# Patient Record
Sex: Female | Born: 1970 | Hispanic: Refuse to answer | Marital: Married | Smoking: Never smoker
Health system: Southern US, Community
[De-identification: ages and names within clinical notes are randomized; demographics above are authoritative.]

## PROBLEM LIST (undated history)

## (undated) HISTORY — PX: OTHER SURGICAL HISTORY: SHX169

---

## 2010-03-01 ENCOUNTER — Ambulatory Visit (HOSPITAL_COMMUNITY): Admission: RE | Admit: 2010-03-01 | Discharge: 2010-03-01 | Payer: Self-pay | Admitting: Obstetrics and Gynecology

## 2010-03-01 ENCOUNTER — Ambulatory Visit: Payer: Self-pay | Admitting: Internal Medicine

## 2010-03-01 ENCOUNTER — Encounter (INDEPENDENT_AMBULATORY_CARE_PROVIDER_SITE_OTHER): Payer: Self-pay | Admitting: Obstetrics and Gynecology

## 2011-09-18 ENCOUNTER — Encounter: Payer: Self-pay | Admitting: Internal Medicine

## 2011-09-18 ENCOUNTER — Emergency Department (HOSPITAL_COMMUNITY): Payer: BC Managed Care – PPO

## 2011-09-18 ENCOUNTER — Observation Stay (HOSPITAL_COMMUNITY)
Admission: EM | Admit: 2011-09-18 | Discharge: 2011-09-19 | Disposition: A | Discharge status: Home / Self Care | Payer: BC Managed Care – PPO | Attending: Internal Medicine | Admitting: Internal Medicine

## 2011-09-18 DIAGNOSIS — Y998 Other external cause status: Secondary | ICD-10-CM | POA: Insufficient documentation

## 2011-09-18 DIAGNOSIS — W19XXXA Unspecified fall, initial encounter: Secondary | ICD-10-CM | POA: Insufficient documentation

## 2011-09-18 DIAGNOSIS — Y92009 Unspecified place in unspecified non-institutional (private) residence as the place of occurrence of the external cause: Secondary | ICD-10-CM | POA: Insufficient documentation

## 2011-09-18 DIAGNOSIS — S01409A Unspecified open wound of unspecified cheek and temporomandibular area, initial encounter: Secondary | ICD-10-CM | POA: Insufficient documentation

## 2011-09-18 DIAGNOSIS — R55 Syncope and collapse: Secondary | ICD-10-CM

## 2011-09-18 LAB — BASIC METABOLIC PANEL
BUN: 15 mg/dL (ref 6–23)
Calcium: 9.5 mg/dL (ref 8.4–10.5)
Creatinine, Ser: 0.78 mg/dL (ref 0.50–1.10)
GFR calc Af Amer: >90 mL/min (ref >90)
GFR calc non Af Amer: >90 mL/min (ref >90)
Glucose, Bld: 91 mg/dL (ref 70–99)

## 2011-09-18 LAB — DIFFERENTIAL
Basophils Absolute: 0 10*3/uL (ref 0.0–0.1)
Basophils Relative: 0 % (ref 0–1)
Eosinophils Absolute: 0 10*3/uL (ref 0.0–0.7)
Lymphs Abs: 1 10*3/uL (ref 0.7–4.0)
Neutrophils Relative %: 80 % — ABNORMAL HIGH (ref 43–77)

## 2011-09-18 LAB — POCT PREGNANCY, URINE: Preg Test, Ur: NEGATIVE

## 2011-09-18 LAB — URINALYSIS, ROUTINE W REFLEX MICROSCOPIC
Leukocytes, UA: NEGATIVE
Nitrite: NEGATIVE
Specific Gravity, Urine: 1.02 (ref 1.005–1.030)
Urobilinogen, UA: 1 mg/dL (ref 0.0–1.0)
pH: 7.5 (ref 5.0–8.0)

## 2011-09-18 LAB — URINE MICROSCOPIC-ADD ON

## 2011-09-18 LAB — POCT I-STAT TROPONIN I: Troponin i, poc: 0 ng/mL (ref 0.00–0.08)

## 2011-09-18 LAB — CBC
Platelets: 222 10*3/uL (ref 150–400)
RBC: 4.45 MIL/uL (ref 3.87–5.11)
WBC: 7 10*3/uL (ref 4.0–10.5)

## 2011-09-18 LAB — D-DIMER, QUANTITATIVE: D-Dimer, Quant: 0.51 ug/mL-FEU — ABNORMAL HIGH (ref 0.00–0.48)
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
## 2011-09-19 DIAGNOSIS — R55 Syncope and collapse: Secondary | ICD-10-CM

## 2011-09-19 LAB — DRUGS OF ABUSE SCREEN W/O ALC, ROUTINE URINE
Barbiturate Quant, Ur: NEGATIVE
Benzodiazepines.: NEGATIVE
Cocaine Metabolites: NEGATIVE
Methadone: NEGATIVE

## 2011-09-19 LAB — BASIC METABOLIC PANEL
BUN: 11 mg/dL (ref 6–23)
CO2: 26 mEq/L (ref 19–32)
Chloride: 104 mEq/L (ref 96–112)
Creatinine, Ser: 0.76 mg/dL (ref 0.50–1.10)
GFR calc Af Amer: >90 mL/min (ref >90)
Glucose, Bld: 89 mg/dL (ref 70–99)
Potassium: 4.2 mEq/L (ref 3.5–5.1)
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
## 2011-09-25 NOTE — Consult Note (Signed)
NAMEDEON, [name] NO.:  [id number]  MEDICAL RECORD NO.:  [id number]  LOCATION:  4705                         FACILITY:  [facility]  PHYSICIAN:  Newman Pies, MD            DATE OF BIRTH:  16-Jan-1971  DATE OF CONSULTATION:  09/18/2011 DATE OF DISCHARGE:                                CONSULTATION   SERVICE:  Otolaryngology Head and Neck Surgery Service.  CHIEF COMPLAINT:  Left facial laceration, syncope.  HISTORY OF PRESENT ILLNESS:  [name] [name] is a 40 year old female who was transported to [name] Emergency room earlier today, after she experienced an episode of syncope.  According to [name] [name], she fell face first in her living room, cutting [name] left side of her face on a glass door.  Her loss of consciousness was very brief.  She was immediately attended to by her family members.  She was transported to [name] Emergency room for further evaluation and treatment. According to [name] [name], she had another episode of syncope once before, during her pregnancy.  She denies any other history of cardiovascular or neurologic disorders.  Upon arrival at [name] emergency room, [name] [name] was noted to have a large 5-cm laceration of her left midface.  [name] laceration created a 3-cm flap over [name] left preauricular area.  ENT was consulted for further evaluation and treatment of [name] left facial laceration.  PAST MEDICAL HISTORY:  Otherwise healthy.  PAST SURGICAL HISTORY:  C-section.  HOME MEDICATIONS:  Birth control pills.  ALLERGIES:  No known drug allergies.  SOCIAL HISTORY:  [name] [name] denies [name] use of any illegal drugs, alcohol, or tobacco.  PHYSICAL EXAMINATION:  VITAL SIGNS:  Temperature 98.8, blood pressure 100/48, pulse 48, respirations 20, oxygen saturation 100% on room air. GENERAL:  [name] [name] is a well-nourished and well-developed 40 year old white female in no acute distress.  She is alert and oriented x3. HEENT:  Her pupils are  equal, round, and reactive to light.  Extraocular motion is intact.  Examination of [name] ears shows normal auricles and external auditory canals.  Nasal examination shows normal mucosa, septum, and turbinates.  Oral cavity examination shows normal lips, gums, tongue, oral cavity, and oropharyngeal mucosa.  Facial examination shows a left midface laceration, measuring approximately 5 cm in length. It results in a 3-cm skin flap over [name] left preauricular area.  Cranial nerves II through XII are intact.  Specifically, [name] [name] has symmetric facial movement. NECK;  Palpation of [name] neck reveals no lymphadenopathy or mass.  [name] trachea is midline.  [name] thyroid is not significantly enlarged.  PROCEDURE PERFORMED:  Intermediate repair of [name] left midface lacerations (5 cm).  ANESTHESIA:  Local anesthesia with 1% lidocaine with 1:100,000 epinephrine.  DESCRIPTION:  [name] [name] is placed supine on her hospital bed.  [name] laceration site is copiously irrigated with saline solution.  [name] laceration site is sterilely prepped with iodine solution.  Lidocaine 1%with 1:100,000 epinephrine is locally infiltrated around [name] laceration area.  [name] laceration skin flap is then carefully debrided of all nonviable tissue.  It is then reapproximated and sutures in place with 4- 0 Vicryl sutures and 5-0  Prolene sutures in a layered fashion.  [name] deep sutures are used to minimize to [name] skin tension.  Good tension-free closure is achieved without difficulty.  Antibiotic ointment is applied.  IMPRESSION:  Left facial laceration secondary to acute syncope episode.  RECOMMENDATION: 1. Intermediate laceration repair in [name] emergency room. 2. [name] [name] will be placed on Keflex 500 mg p.o. q.i.d. for 5 days.     She may also take Vicodin 1-2 tablets p.o. q.4-6 h. p.r.n. pain.     [name] [name] will follow up in my office in 1 week for suture     removal.     Newman Pies, MD     ST/MEDQ  D:  09/18/2011  T:   09/19/2011  Job:  [id number]  Electronically Signed by Newman Pies MD on 09/25/2011 10:20:28 AM
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
## 2011-11-16 ENCOUNTER — Ambulatory Visit: Payer: BC Managed Care – PPO

## 2012-03-05 ENCOUNTER — Encounter: Payer: Self-pay | Admitting: Sports Medicine

## 2012-03-05 ENCOUNTER — Ambulatory Visit (INDEPENDENT_AMBULATORY_CARE_PROVIDER_SITE_OTHER): Payer: BC Managed Care – PPO | Admitting: Sports Medicine

## 2012-03-05 VITALS — BP 123/70 | HR 48 | Ht 63.0 in | Wt 108.0 lb

## 2012-03-05 DIAGNOSIS — M1711 Unilateral primary osteoarthritis, right knee: Secondary | ICD-10-CM | POA: Insufficient documentation

## 2012-03-05 DIAGNOSIS — S83003A Unspecified subluxation of unspecified patella, initial encounter: Secondary | ICD-10-CM

## 2012-03-05 DIAGNOSIS — M25569 Pain in unspecified knee: Secondary | ICD-10-CM

## 2012-03-05 DIAGNOSIS — M25561 Pain in right knee: Secondary | ICD-10-CM

## 2012-03-05 DIAGNOSIS — S83006A Unspecified dislocation of unspecified patella, initial encounter: Secondary | ICD-10-CM

## 2012-03-05 NOTE — Assessment & Plan Note (Signed)
I think she has had a significant subluxation that has led to an osteochondral loose body seen on [place]  We will follow conservatively However, if this does not resolve she would need arthroscopy

## 2012-03-05 NOTE — Assessment & Plan Note (Signed)
See instructions OTC meds  Ice  Consider injection if not better
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
## 2012-03-11 ENCOUNTER — Ambulatory Visit (INDEPENDENT_AMBULATORY_CARE_PROVIDER_SITE_OTHER): Payer: BC Managed Care – PPO | Admitting: Physician Assistant

## 2012-03-11 VITALS — BP 111/71 | HR 64 | Temp 98.5°F | Resp 16 | Ht 63.0 in | Wt 111.0 lb

## 2012-03-11 DIAGNOSIS — H109 Unspecified conjunctivitis: Secondary | ICD-10-CM

## 2012-03-11 MED ORDER — MOXIFLOXACIN HCL 0.5 % OP SOLN: 1.0000 [drp] | Freq: Three times a day (TID) | OPHTHALMIC | Status: AC

## 2012-03-11 NOTE — Progress Notes (Signed)
  Subjective:    Patient ID: [name] [name], female    DOB: 1971/10/13, 41 y.o.   MRN: [id number]  HPI [name] [name] comes in tonight c/o left eye redness and drainage this morning.  Has had some scratchy throat and itchiness for 2 days and thought it was allergies. Used allergy drops this morning but seemed to make it worse.  No light sensitivity but does have some discomfort.  Thick yellow drainage from eye all day.  Does not wear contact lenses.  No one else sick in house. No vision change today.   Review of Systems As above     Objective:   Physical Exam  HENT:  Right Ear: Tympanic membrane normal.  Left Ear: Tympanic membrane normal.  Nose: Nose normal.  Mouth/Throat: Oropharynx is clear and moist.  Eyes: Pupils are equal, round, and reactive to light. Left eye exhibits chemosis, discharge and exudate. Left eye exhibits no hordeolum. Left conjunctiva is not injected. No scleral icterus. Left eye exhibits normal extraocular motion. Left pupil is round and reactive. Pupils are equal.  Lymphadenopathy:       Head (left side): No preauricular adenopathy present.   Ofloxacin drops given in office.       Assessment & Plan:  Conjunctivitis, bacterial  Home with Ofloxacin drops until to the pharmacy. Vigamox 3 times a day for 7 days. Hand wash.   Watch for increased photophobia, eye pain, vision change.

## 2012-03-17 ENCOUNTER — Ambulatory Visit (INDEPENDENT_AMBULATORY_CARE_PROVIDER_SITE_OTHER): Payer: BC Managed Care – PPO | Admitting: Sports Medicine

## 2012-03-17 VITALS — BP 100/62

## 2012-03-17 DIAGNOSIS — S83003A Unspecified subluxation of unspecified patella, initial encounter: Secondary | ICD-10-CM

## 2012-03-17 DIAGNOSIS — M25569 Pain in unspecified knee: Secondary | ICD-10-CM

## 2012-03-17 DIAGNOSIS — M25561 Pain in right knee: Secondary | ICD-10-CM

## 2012-03-17 DIAGNOSIS — S83006A Unspecified dislocation of unspecified patella, initial encounter: Secondary | ICD-10-CM

## 2012-03-17 NOTE — Patient Instructions (Addendum)
Follow up in 4 wks Start biking Continue with the sleeve Continue quad and VMO training

## 2012-03-18 NOTE — Assessment & Plan Note (Signed)
Improvement with less pain  She is not having to use any NSAIDs  Continue icing and up when necessary medications

## 2012-03-18 NOTE — Assessment & Plan Note (Signed)
Continue using the compression sleeve which I think gives her less subluxation  oK. to start biking and swimming  I think she should avoid running for the next 4 weeks  We discussed that if the loose body continues to get trapped under the kneecap she might have to have arthroscopy to remove this  Recheck 4 weeks

## 2012-03-18 NOTE — Progress Notes (Signed)
  Subjective:    Patient ID: [name] [name], female    DOB: 19-Mar-1971, 41 y.o.   MRN: [id number]  HPI Patient returns for followup of right knee pain Last visit we felt she had a chondral lesion that had broken loose from the right upper patella She had marked swelling and could not walk without limp Now she can walk more comfortably She says this is 50% better Compression sleeve is clearly helping with the swelling Pain is less and she feels that she can swim or bike but would not try running yet  Review of Systems     Objective:   Physical Exam  No acute distress  Right knee shows much less swelling Stable ligaments Patella tracks laterally Tenderness at the upper outer corner persists but is less Quadriceps and patellar tendons are nontender  Walking gait is without a limp today  MSK ultrasound Loose body is less visible today and looks less hyperechoic There is a mild effusion but much less than before Patellar and quadriceps tendons appear normal Menisci look normal      Assessment & Plan:

## 2012-04-14 ENCOUNTER — Ambulatory Visit (INDEPENDENT_AMBULATORY_CARE_PROVIDER_SITE_OTHER): Payer: BC Managed Care – PPO | Admitting: Sports Medicine

## 2012-04-14 VITALS — BP 104/60

## 2012-04-14 DIAGNOSIS — S83003A Unspecified subluxation of unspecified patella, initial encounter: Secondary | ICD-10-CM

## 2012-04-14 DIAGNOSIS — M25561 Pain in right knee: Secondary | ICD-10-CM

## 2012-04-14 DIAGNOSIS — M25569 Pain in unspecified knee: Secondary | ICD-10-CM

## 2012-04-14 DIAGNOSIS — S83006A Unspecified dislocation of unspecified patella, initial encounter: Secondary | ICD-10-CM

## 2012-04-14 NOTE — Assessment & Plan Note (Signed)
This is subluxing much less with swelling down and better strength  I would use compression next 6 wks and maybe we can stop after that
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
## 2012-04-14 NOTE — Assessment & Plan Note (Addendum)
Improving pain however persistent upper lateral pain (from bony fragment) and lower medial pain (from injury to medial patellar ligament following subluxation).  Ultrasound shows bony fragment scarring down and improved patellar effusion.  PLAN: -Stretches to help with flexion and extension of right leg. Diminished range of motion likely from bony fragment.  Passive weigth to assist stretches to try to achieve full extension -May do slow runs and cross train -Follow-up in 6 weeks for re-evaluation.

## 2012-04-14 NOTE — Patient Instructions (Signed)
Exercises/stretches. -Passive extension exercises: foot on book, apply weights on knee to help straighten knee for 1 minute, straighten out. Repeat 5 times.  -Continue straight leg lifts with ankle weights.  -Lay in belly with feet hanging off an edge and use ankle to press right knee straight.   May continue to run but at slower pace.   Use compression all the time if possible, definitely during and after exercise.   Follow-up in 6 weeks.

## 2012-05-26 ENCOUNTER — Ambulatory Visit (INDEPENDENT_AMBULATORY_CARE_PROVIDER_SITE_OTHER): Payer: BC Managed Care – PPO | Admitting: Sports Medicine

## 2012-05-26 VITALS — BP 118/76

## 2012-05-26 DIAGNOSIS — R269 Unspecified abnormalities of gait and mobility: Secondary | ICD-10-CM

## 2012-05-26 DIAGNOSIS — S83003A Unspecified subluxation of unspecified patella, initial encounter: Secondary | ICD-10-CM

## 2012-05-26 DIAGNOSIS — S83006A Unspecified dislocation of unspecified patella, initial encounter: Secondary | ICD-10-CM

## 2012-05-26 NOTE — Patient Instructions (Addendum)
1. Try the different size heel lifts in your shoes to determine which one is the most comfortable.  2. Continue doing your strengthening exercises.  Add the hamstring exercises that we discussed today.  (reverse plank with the ball and the butt stretch on the counter)  3. Follow up in 2-3 months.

## 2012-05-27 DIAGNOSIS — R269 Unspecified abnormalities of gait and mobility: Secondary | ICD-10-CM | POA: Insufficient documentation

## 2012-05-27 NOTE — Assessment & Plan Note (Signed)
She is much improved here.  She will continue her HEP and knee sleeve.

## 2012-05-27 NOTE — Progress Notes (Signed)
  Subjective:    Patient ID: [name] [name], female    DOB: 1971-04-21, 41 y.o.   MRN: [id number]  HPI 41 y/o female is here to follow up on right knee pain secondary to subluxation.  Her symptoms are 40% improved.  She is doing her HEP and wearing her sleeve for running.  She occasionally gets buttock and hamstring pain with running that occasionally radiates down the leg. It lasts for seconds and then resolves.  She doesn't have these symptoms at rest.  She has been able to return to even longer runs without much pain now   Review of Systems     Objective:   Physical Exam  Right leg is almost a cm longer than the left No pain with rotation of the hip Normal ROM of the hip and knee No tenderness to palpation of the posterior thigh Knee has no effusion No apprehension No tenderness to palpation Good quad strength  Gait: neutral feet but the left shoulder rides low.  This is also seen in the hips.  The hips are neutral with a heel lift.  The left shoulder is improved but still a little low.       Assessment & Plan:

## 2012-05-27 NOTE — Assessment & Plan Note (Signed)
Suspect that her leg pain is from an increase in pressure on the long leg with running.  The heel lift should even out the stride and decrease the pressure.  We have also given her hamstring strengthening and stretching as well as piriformis stretching.

## 2012-07-28 ENCOUNTER — Encounter: Payer: Self-pay | Admitting: Sports Medicine

## 2012-07-28 ENCOUNTER — Ambulatory Visit (INDEPENDENT_AMBULATORY_CARE_PROVIDER_SITE_OTHER): Payer: BC Managed Care – PPO | Admitting: Sports Medicine

## 2012-07-28 VITALS — BP 112/68 | HR 66

## 2012-07-28 DIAGNOSIS — S83003A Unspecified subluxation of unspecified patella, initial encounter: Secondary | ICD-10-CM

## 2012-07-28 DIAGNOSIS — M25561 Pain in right knee: Secondary | ICD-10-CM

## 2012-07-28 DIAGNOSIS — M25569 Pain in unspecified knee: Secondary | ICD-10-CM

## 2012-07-28 DIAGNOSIS — S83006A Unspecified dislocation of unspecified patella, initial encounter: Secondary | ICD-10-CM

## 2012-07-28 NOTE — Patient Instructions (Signed)
You have been referred to [name] [name] for physical therapy.

## 2012-07-28 NOTE — Assessment & Plan Note (Signed)
I think she has tried a good HEP but still has tracking issues  Refer to PT and see if we can improve her tracking and lessen her sxs  Use compression bilat  Reck 6 wks

## 2012-07-28 NOTE — Progress Notes (Signed)
  Subjective:    Patient ID: [name] [name], female    DOB: 21-Jan-1971, 41 y.o.   MRN: [id number]  HPI 76 yof runner with history of right knee subluxation and subsequent avulsed bony fragment of the superolateral patella presents for follow up of knee pain.  The initial injury was approximately five months ago.  Patient states knee is at about 60% of baseline.  She feels pain over the anterior and lower aspect of the knee.  She has markedly decreased her running and has been swimming and cycling instead.  She wears the body helix knee compression sleeve which provides improvement of pain and seems to keep the patella in place.  She recently went hiking in [place] and finds difficulty in when going down declines.  She does feel like she has clicking in the knee, but has not had locking.  She has no swelling.  She takes ibuprofen as needed.      Review of Systems ROS otherwise negative.    Objective:   Physical Exam Gen - alert and oriented, nad, well appearing HEENT - ncat, mmm, EOMI, nl conjunctiva Resp - respirations non-labored Psych - appropriate mood and affect MS, Right knee: No effusion. VMO atrophy. Mildly ttp over lateral joint line.   Normal patellla tracking with extension. Creptius with ROM testing bilaterally. Positive patellar grind test. McMurrays test causes patellar grinding, no meniscal symptoms. Negative Lachman's, anterior drawer, posterior drawer.  No valgus or varus instability.           Assessment & Plan:  14 yof with h/o patellar subluxation and associated patellar avulsion presents for follow up of right knee pain.  1.  Patellofemoral syndrome. -  Suspect pain is actually from tracking abnormality and not from patellar avulsion fragment based on location. -  Patient has significant VMO weakness and would benefit from retraining of muscle.  Will refer to PT. -  Continue to wear body helix compression sleeve, and recommend wearing on left knee also since  has similar issues.   -  Recommended caution with downhill running (will be competing in the upcoming [place] relay).  2.  Leg length discrepancy. -  Continue to wear heel pad in shoes.  RTC in 6 weeks.

## 2012-07-31 ENCOUNTER — Ambulatory Visit: Payer: BC Managed Care – PPO | Admitting: Sports Medicine

## 2012-08-04 ENCOUNTER — Ambulatory Visit (INDEPENDENT_AMBULATORY_CARE_PROVIDER_SITE_OTHER): Payer: BC Managed Care – PPO | Admitting: Emergency Medicine

## 2012-08-04 VITALS — BP 98/52 | HR 56 | Temp 97.5°F | Resp 16 | Ht 63.0 in | Wt 112.0 lb

## 2012-08-04 DIAGNOSIS — K5289 Other specified noninfective gastroenteritis and colitis: Secondary | ICD-10-CM

## 2012-08-04 DIAGNOSIS — K529 Noninfective gastroenteritis and colitis, unspecified: Secondary | ICD-10-CM

## 2012-08-04 MED ORDER — CIPROFLOXACIN HCL 500 MG PO TABS: 500.0000 mg | ORAL_TABLET | Freq: Two times a day (BID) | ORAL | Status: AC

## 2012-08-04 NOTE — Progress Notes (Signed)
  Date:  08/04/2012   Name:  Lanee Chain   DOB:  Aug 02, 1971   MRN:  [id number] Gender: female Age: 41 y.o.  PCP:  No primary provider on file.    Chief Complaint: Diarrhea   History of Present Illness:  Jeanene Mena is a 41 y.o. pleasant patient who presents with the following:  Working in the garden Saturday spreading manure.  Thinks she may have ingested some manure as she has nausea, belching and frequent loose stools.  No fever or chills, no vomiting, no blood, mucous, or pus in stools.  Belching a lot yesterday with a foul taste in her mouth.  Poor appetite.  No one else in her group is ill and they all ate the same food over the weekend.  Patient Active Problem List  Diagnosis  . Knee pain, right  . Patellar subluxation  . Abnormal gait    No past medical history on file.  No past surgical history on file.  History  Substance Use Topics  . Smoking status: Never Smoker   . Smokeless tobacco: Never Used  . Alcohol Use: Not on file    No family history on file.  No Known Allergies  Medication list has been reviewed and updated.  Current Outpatient Prescriptions on File Prior to Visit  Medication Sig Dispense Refill  . Norethindrone-Ethinyl Estradiol Biphasic (NECON 10/11) 0.5-35/1-35 MG-MCG tablet Take 1 tablet by mouth daily.        Review of Systems:  As per HPI, otherwise negative.    Physical Examination: Filed Vitals:   08/04/12 1133  BP: 98/52  Pulse: 56  Temp: 97.5 F (36.4 C)  Resp: 16   Filed Vitals:   08/04/12 1133  Height: 5\' 3"  (1.6 m)  Weight: 112 lb (50.803 kg)   Body mass index is 19.84 kg/(m^2). Ideal Body Weight: Weight in (lb) to have BMI = 25: 140.8   GEN: WDWN, NAD, Non-toxic, A & O x 3 HEENT: Atraumatic, Normocephalic. Neck supple. No masses, No LAD. Ears and Nose: No external deformity. CV: RRR, No M/G/R. No JVD. No thrill. No extra heart sounds. PULM: CTA B, no wheezes, crackles, rhonchi. No retractions. No resp.  distress. No accessory muscle use. ABD: S, NT, ND, +BS. No rebound. No HSM. EXTR: No c/c/e NEURO Normal gait.  PSYCH: Normally interactive. Conversant. Not depressed or anxious appearing.  Calm demeanor.    Assessment and Plan: Gastroenteritis Requests antibiotic as she is in a race on the upcoming weekend. cipro Clears for today.  Carmelina Dane, MD

## 2012-08-20 ENCOUNTER — Telehealth: Payer: Self-pay

## 2012-08-20 ENCOUNTER — Encounter: Payer: Self-pay | Admitting: Radiology

## 2012-08-20 NOTE — Telephone Encounter (Signed)
Called patient she needs to return to clinic for further eval. ? Additional studies or labs may need to be done. Left message for her to call me back.

## 2012-08-20 NOTE — Telephone Encounter (Signed)
Daughter is having same problems now and has seen pediatrician. Pediatrician wanted her daughter to give stool sample. I have advised patient to come in for this, to make sure other tests are not indicated for her. She only took 7 days of the Cipro. She has asked about this, she then states she later took the other 3 days of the Cipro. I have advised her it would have been ideal for her to have taken the full 10 day course of the medication at one time. She is advised to come in for this. She states she has collected a stool sample in the container from her daughters pediatrician. I have advised if she needs samples done we will give her the proper supplies to do this she is advised not to bring a stool sample with her when she comes in tomorrow.

## 2012-08-20 NOTE — Telephone Encounter (Signed)
PATIENT WAS SEEN AROUND LABOR DAY FOR STOMACH ISSUES BY DR Dareen Piano.  THE MEDICINE SHE WAS PLACED ON HELPED BUT NOW HER SYMPTOMS ARE RECURRING.  HER HUSBAND AND CHILD ARE ALSO HAVING THE SAME PROBLEM.  SHE WANTS TO TALK TO SOMEONE ABOUT BRINGING IN A SAMPLE.  [phone]

## 2012-09-16 ENCOUNTER — Ambulatory Visit (INDEPENDENT_AMBULATORY_CARE_PROVIDER_SITE_OTHER): Payer: BC Managed Care – PPO | Admitting: Sports Medicine

## 2012-09-16 VITALS — BP 110/60 | Ht 63.0 in | Wt 108.0 lb

## 2012-09-16 DIAGNOSIS — S83003A Unspecified subluxation of unspecified patella, initial encounter: Secondary | ICD-10-CM

## 2012-09-16 DIAGNOSIS — S83006A Unspecified dislocation of unspecified patella, initial encounter: Secondary | ICD-10-CM

## 2012-09-16 NOTE — Assessment & Plan Note (Signed)
This is much improved and back to a stable level  I she is wise to continue using compression sleeves because she said so many bouts of patellar subluxation  Return to clinic when necessary

## 2012-09-16 NOTE — Progress Notes (Signed)
  Subjective:    Patient ID: [name] [name], female    DOB: February 06, 1971, 41 y.o.   MRN: [id number]  HPI [name] [name] is here today for follow up of right patellar subluxation.  She has been doing physical therapy with Amado Coe and reports great improvement.  Able to run well and planning on doing an interval work out today.  Does have some pain/limping several hours after activity.  Wearing the compression sleeve mostly walking.   Review of Systems     Objective:   Physical Exam Gen: alert, cooperative, NAD Right Knee: Normal to inspection with no erythema or effusion or obvious bony abnormalities. Palpation normal with no warmth, joint line tenderness, patellar tenderness, or condyle tenderness. ROM full in flexion and extension and lower leg rotation. Patellar glide positive for crepitus on lateral and medial aspects. Patellar and quadriceps tendons unremarkable. Hamstring and quadriceps strength is normal. -VMO quad muscle comes to the superior aspect of the patella but on LT still extends about 1 cm further  Hip abductors, adductors, flexors with excellent strength   Running gait is neutral         Assessment & Plan:  Patellar subluxation: Much improved with PT exercises.  Continue to do exercises for the VMO, hip adductors, and hip abductors - okay to switch to home program.  It will be important to use the compression sleeve, particularly with walking/hiking.  Gradually increase training intensity as tolerated.  Follow up as needed.

## 2013-11-17 ENCOUNTER — Encounter: Payer: Self-pay | Admitting: Family Medicine

## 2013-11-17 ENCOUNTER — Ambulatory Visit (INDEPENDENT_AMBULATORY_CARE_PROVIDER_SITE_OTHER): Payer: BC Managed Care – PPO | Admitting: Family Medicine

## 2013-11-17 VITALS — BP 130/69 | Ht 63.0 in | Wt 108.0 lb

## 2013-11-17 DIAGNOSIS — S86911A Strain of unspecified muscle(s) and tendon(s) at lower leg level, right leg, initial encounter: Secondary | ICD-10-CM

## 2013-11-17 DIAGNOSIS — IMO0002 Reserved for concepts with insufficient information to code with codable children: Secondary | ICD-10-CM

## 2013-11-17 MED ORDER — NITROGLYCERIN 0.2 MG/HR TD PT24: 0.2000 mg | MEDICATED_PATCH | Freq: Every day | TRANSDERMAL | Status: DC

## 2013-11-17 NOTE — Patient Instructions (Signed)
Thank you for coming in today  You have a partial tear of your quad tendon  Relative rest for 2 weeks. Cross train with biking/swimming/elliptical Compression sleeve during day for next week, then during activity and for 30 min after Ice after workouts In 2 weeks, begin decline squat 3x15 When you are pain free with walking and above activities, start to slowly introduce running Start nitroglycerin patch  Nitroglycerin Protocol   Apply 1/4 nitroglycerin patch to affected area daily.  Change position of patch within the affected area every 24 hours.  You may experience a headache during the first 1-2 weeks of using the patch, these should subside.  If you experience headaches after beginning nitroglycerin patch treatment, you may take your preferred over the counter pain reliever.  Another side effect of the nitroglycerin patch is skin irritation or rash related to patch adhesive.  Please notify our office if you develop more severe headaches or rash, and stop the patch.  Tendon healing with nitroglycerin patch may require 12 to 24 weeks depending on the extent of injury.  Men should not use if taking Viagra, Cialis, or Levitra.   Do not use if you have migraines or rosacea.   Followup in 4 weeks

## 2013-11-17 NOTE — Progress Notes (Signed)
CC: Right knee pain HPI: Patient is a very pleasant 42 year old female runner presents for evaluation of right knee pain. She states that her right knee has bothered her intermittently for the last 2 years. She thinks her patella subluxes. At times she in the past she has had difficulty with swelling but this current injury seems different. She is currently running 5-6 days per week and is doing one tract run, one long run, 1 temporal run, and to recovery runs. In total she is running 25-30 miles per week. She was previously cycling to maintain her quad strength but has not been doing this recently due to the cold weather. She states that she has a history of a left knee scope with left lateral meniscectomy but this knee has not given her any trouble. She ran the [place] half marathon over the weekend and has had right knee pain since the race. She notes a sharp pain over the superolateral pole of her patella. She will feel a click and sometimes this will get better. However, now she has had repeated sharp bouts of pain and clicking. The knee is also giving out on her. She has been wearing a compression sleeve but has not tried any ice or medications. She denies any swelling of the knee joint.  ROS: As above in the HPI. All other systems are stable or negative.  OBJECTIVE: APPEARANCE:  Patient in no acute distress.The patient appeared well nourished and normally developed. HEENT: No scleral icterus. Conjunctiva non-injected Resp: Non labored Skin: No rash MSK:  Right Knee - Inspection normal with no erythema or effusion or obvious bony abnormalities.  - Palpation normal with no warmth or joint line tenderness. TTP at lateral and midportion of quad tendon.  - ROM decreased in flexion due to pain with deep knee flexion. - Strength 5/5 in flexion and extension. - Ligaments with solid consistent endpoints including ACL, PCL, LCL, MCL.  - Negative Mcmurray's.  - Non painful patellar compression.  -  Neurovascularly intact   MSK [place]: Limited ultrasound of the right knee was performed in transverse and longitudinal views. There was a small amount of hypoechoic fluid in the suprapatellar pouch. The medial aspect of the quad tendon is normal in appearance. At the midportion of the quad tendon there is hypoechoic fluid collection at the deep aspect of the tendon with hyperechoic linear retracted fibers consistent with partial quad tear at the deep margin of the tendon. At the lateral aspect of the quad tendon there is again visualized the hypoechoic fluid collection.   ASSESSMENT: #1. Partial right quad tendon tear at lateral aspect  PLAN: Discussed patient's diagnosis with her. Would recommend a period of relative rest with avoidance of running for the next 2 weeks. She may crosstraining with biking and swimming. Recommend that she wear a knee compression sleeve during the day for the next week and then during and after activity for the next several months. She should ice the knee after exercise. I would like her to begin to decline squats after 2 weeks. We will also start her on a nitroglycerin patch and she was given instructions for this protocol. We will see her back in 4 weeks for reevaluation. I counseled her to be very cautious about her running into avoid anything that increases her pain during or after exercise as this will prolong her recovery period .

## 2013-12-15 ENCOUNTER — Encounter: Payer: Self-pay | Admitting: Sports Medicine

## 2013-12-15 ENCOUNTER — Ambulatory Visit (INDEPENDENT_AMBULATORY_CARE_PROVIDER_SITE_OTHER): Payer: BC Managed Care – PPO | Admitting: Sports Medicine

## 2013-12-15 VITALS — BP 106/71 | HR 64 | Ht 63.0 in | Wt 108.0 lb

## 2013-12-15 DIAGNOSIS — M76899 Other specified enthesopathies of unspecified lower limb, excluding foot: Secondary | ICD-10-CM | POA: Insufficient documentation

## 2013-12-15 DIAGNOSIS — M25569 Pain in unspecified knee: Secondary | ICD-10-CM

## 2013-12-15 DIAGNOSIS — M25561 Pain in right knee: Secondary | ICD-10-CM

## 2013-12-15 DIAGNOSIS — M6580 Other synovitis and tenosynovitis, unspecified site: Secondary | ICD-10-CM

## 2013-12-15 NOTE — Assessment & Plan Note (Signed)
Her last visit she had a partial quadriceps tear  This is much improved

## 2013-12-15 NOTE — Progress Notes (Signed)
Patient ID: [name] LoronHollis Henrickson, female   DOB: 04/22/1971, [age] y.o.   MRN: [id number]  Followup of a right partial quadriceps tear following  half marathon 11/04/13  History of patellar subluxation  In August of this past year she also had a sharp pain in the area but did not have that evaluated as she got better and she ran several races  Since last visit she has been biking She had a viral illness for one week and feel she is a bit behind on exercises  However her pain is 0 level except with activity No pain now with cycling She tried some squats for rehabilitation but they still are somewhat painful  On 10 days of starting nitroglycerin pain had gone away She also feels more stable coming down steps starting 2 weeks after her injury  Exam  NO Acute distress and physically fit  BP 106/71  Pulse 64  Ht 5\' 3"  (1.6 m)  Wt 108 lb (48.988 kg)  BMI 19.14 kg/m2  RT Knee: Normal to inspection with no erythema or effusion or obvious bony abnormalities. Palpation normal with no warmth or joint line tenderness or patellar tenderness or condyle tenderness. ROM normal in flexion and extension and lower leg rotation. Ligaments with solid consistent endpoints including ACL, PCL, LCL, MCL. Negative Mcmurray's and provocative meniscal tests. Non painful patellar compression. Patellar and quadriceps tendons unremarkable. Hamstring and quadriceps strength is normal.  Ultrasound Partial quadriceps tear on lateral aspect seems to be 80% resolved No Effusion There is a loose body that looks like a calcified fragment in the lateral pouch

## 2013-12-15 NOTE — Assessment & Plan Note (Signed)
Continue nitroglycerin protocol  She does well with a compression sleeve but I would like to try her on 1 with an open patella  Okay to start running and gradually increase activity  Recheck in 2 months

## 2014-01-06 ENCOUNTER — Ambulatory Visit (INDEPENDENT_AMBULATORY_CARE_PROVIDER_SITE_OTHER): Payer: BC Managed Care – PPO | Admitting: Family Medicine

## 2014-01-06 ENCOUNTER — Ambulatory Visit
Admission: RE | Admit: 2014-01-06 | Discharge: 2014-01-06 | Disposition: A | Discharge status: Home / Self Care | Payer: BC Managed Care – PPO | Source: Ambulatory Visit | Attending: Family Medicine | Admitting: Family Medicine

## 2014-01-06 ENCOUNTER — Telehealth: Payer: Self-pay | Admitting: Family Medicine

## 2014-01-06 ENCOUNTER — Encounter: Payer: Self-pay | Admitting: Family Medicine

## 2014-01-06 VITALS — BP 119/74 | HR 62 | Ht 63.0 in | Wt 108.0 lb

## 2014-01-06 DIAGNOSIS — M25569 Pain in unspecified knee: Secondary | ICD-10-CM

## 2014-01-06 NOTE — Telephone Encounter (Signed)
Called to discuss knee xray result.  No loose body on xray. Continue plan as discussed in office today.

## 2014-01-06 NOTE — Progress Notes (Signed)
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 We have been treating her recently for a quadriceps tendinopathy with partial tear. She has been treated with rehabilitation and nitroglycerin as well as relative rest and was doing quite well. On January 13 she was cleared by Dr. fields to return to running. She states that initially she was doing very well with this. She was gradually increasing her speed and mileage. However, she notes that for the last 2 days she has had increased knee discomfort. She continues to do some biking. She thinks that her increased pain may have been triggered by 3 consecutive days of running of 3 a half miles each. She ran longer, faster, and consecutive days. It feels stiff and creaky and sore. She is concerned about whether she may have reinjured her knee. Pain is lateral to the kneecap.  ROS: As above in the HPI. All other systems are stable or negative.  OBJECTIVE: APPEARANCE:  Patient in no acute distress.The patient appeared well nourished and normally developed. HEENT: No scleral icterus. Conjunctiva non-injected Resp: Non labored Skin: No rash MSK:  Right Knee - Inspection normal with no erythema or effusion or obvious bony abnormalities.  - Palpation normal with no warmth or joint line tenderness. No TTP at patellar or quad tendon.  - ROM normal in flexion and extension. - Strength 5/5 in flexion and extension. - Neurovascularly intact  MSK [place]: Limited ultrasound of the right quadriceps tendon was performed today. Tendon appears well-healed at this point with normal fiber alignment no evidence of tear or hypoechoic change within the tendon. However, there is a mild knee effusion with fluid in the suprapatellar pouch. There is a calcification within the suprapatellar pouch that is quite prominent and large in appearance.   ASSESSMENT: #1. Recurrent right knee  pain with knee effusion. Suspect may be secondary to the rotation from the calcification within her knee. This may be a loose body in her suprapatellar pouch #2. Resolving right quadriceps tendinopathy and partial tear. Normal in appearance on ultrasound today   PLAN: We will continue the nitroglycerin patch as her quadriceps tendon continues to heal. We will obtain knee x-rays to evaluate for loose body within the knee which may be contributing to her recurrent knee pain as well as to the initial quadriceps tendon tear. I have asked her to back down on her running slightly, and increase more gradually. She was encouraged to run no more than every other day. She should continue cycling. I have also asked her to make sure that she does not change to much at once. Meaning only change distance, speed. She will followup in 3 weeks for recheck.

## 2014-01-06 NOTE — Patient Instructions (Signed)
1. Continue nitroglycerin and compression 2. Work in [employer] strengthening   - Straight leg raise with toe turned out  - Short arc quad with ball squeeze  - Wall squat to 60 degrees with ball squeeze 3. Only run every other day. Try to either increase mileage or speed.  4. Keep mileage increases to 2 miles per week 5. Xray knee  Followup 3 weeks

## 2014-01-08 ENCOUNTER — Other Ambulatory Visit: Payer: BC Managed Care – PPO | Admitting: Family Medicine

## 2014-01-27 ENCOUNTER — Ambulatory Visit (INDEPENDENT_AMBULATORY_CARE_PROVIDER_SITE_OTHER): Payer: BC Managed Care – PPO | Admitting: Family Medicine

## 2014-01-27 ENCOUNTER — Encounter: Payer: Self-pay | Admitting: Family Medicine

## 2014-01-27 VITALS — BP 125/70 | Ht 63.0 in | Wt 108.0 lb

## 2014-01-27 DIAGNOSIS — M25561 Pain in right knee: Secondary | ICD-10-CM

## 2014-01-27 DIAGNOSIS — M6750 Plica syndrome, unspecified knee: Secondary | ICD-10-CM

## 2014-01-27 DIAGNOSIS — M6580 Other synovitis and tenosynovitis, unspecified site: Secondary | ICD-10-CM

## 2014-01-27 DIAGNOSIS — M7650 Patellar tendinitis, unspecified knee: Secondary | ICD-10-CM

## 2014-01-27 DIAGNOSIS — M76899 Other specified enthesopathies of unspecified lower limb, excluding foot: Secondary | ICD-10-CM

## 2014-01-27 DIAGNOSIS — M25569 Pain in unspecified knee: Secondary | ICD-10-CM

## 2014-01-27 NOTE — Progress Notes (Signed)
CC: Followup right knee pain HPI: [name] S. is a very pleasant 43 year old female runner who presents for followup of right knee pain. She previously had a partial tear of her right quadriceps tendon. When I last saw her that pain had resolved but she was having some additional discomfort in her knee. Ultrasound showed a small effusion as well as some loose bodies. X-rays were obtained which did not show any loose bodies of substantial size to see on the x-ray. Patient returns today overall doing okay. Acute soreness in her quad tendon is completely gone at this point. She does have some soreness in the anterior knee over the area of the patellar tendon as well as an area lateral to the patella where she feels like something is moving in there and it is very awkward. It is not painful there though. She has not really increased her mileage at [name] since I last saw her do to the weather and vacation.  ROS: As above in the HPI. [name] other systems are stable or negative.  OBJECTIVE: APPEARANCE:  Patient in no acute distress.The patient appeared well nourished and normally developed. HEENT: No scleral icterus. Conjunctiva non-injected Resp: Non labored Skin: No rash MSK:  Right Knee exam: - No effusion or deformity - Full range of motion - Large nontender plica at the lateral aspect of the right knee - No joint line tenderness - Full strength on knee extension Normal hip abduction strength  MSK US: Not performed   ASSESSMENT: #1. Partial tear of right quadriceps tendon, resolved #2. Early right patellar tendinitis #3. Hypertrophied plica  PLAN: Patient offered reassurance about the plica. Would not recommend any intervention as long as this is not painful. I did encourage her to do an ice massage to the area after exercise in hopes that this will strength down again. If it does become painful in the future we can consider injection if needed. For her patellar tendinitis, she will continue to  decline squats. We gave her a patellar tendon strap today to use instead of the knee compression sleeve at this point. She will move the nitroglycerin patch to the patellar tendon from the quad tendon. She does have an upcoming half marathon that she would like to run walk 2-1/2 weeks from now. We discussed appropriate increase in training to see if she will be able to tolerate this raise. Did strongly encourage her that if she does start to have pain that she back out of the race.

## 2014-01-27 NOTE — Patient Instructions (Signed)
Thank you for coming in today  1. Continue decline squats 2. Okay to gradually increase speed or mileage over time 3. Move nitro patch to patellar tendon 4. Try patellar strap 5. Ice after workout, both patellar tendon and ice massage of plica  Keep followup with Dr. Darrick PennaFields in March

## 2014-02-16 ENCOUNTER — Encounter: Payer: Self-pay | Admitting: Sports Medicine

## 2014-02-16 ENCOUNTER — Ambulatory Visit (INDEPENDENT_AMBULATORY_CARE_PROVIDER_SITE_OTHER): Payer: BC Managed Care – PPO | Admitting: Sports Medicine

## 2014-02-16 VITALS — BP 119/72 | HR 52 | Ht 63.0 in | Wt 108.0 lb

## 2014-02-16 DIAGNOSIS — M25561 Pain in right knee: Secondary | ICD-10-CM

## 2014-02-16 DIAGNOSIS — M6580 Other synovitis and tenosynovitis, unspecified site: Secondary | ICD-10-CM

## 2014-02-16 DIAGNOSIS — M25569 Pain in unspecified knee: Secondary | ICD-10-CM

## 2014-02-16 DIAGNOSIS — M76899 Other specified enthesopathies of unspecified lower limb, excluding foot: Secondary | ICD-10-CM

## 2014-02-16 NOTE — Progress Notes (Signed)
Patient ID: [name] [name], female   DOB: 11/11/1971, 43 y.o.   MRN: [id number]    Subjective: HPI: Patient is a 43 y.o. female presenting to clinic today for follow up on right knee pain.  [name] [name] is a runner with a history of patellar subluxation. Patient's initial concern a few months ago was right quad tendonitis with a partial tear, which has resolved with exercise and NGT patch. She then had patellar tendinitis a few weeks ago and was advised to move her NGT to the patellar tendon. She was also given a patella strap which she could not tolerate. She continues to do her strengthening exercises and is taking it easy with her running. She is currently pain free. She ran in a 1/2 marathon 2 days ago which she tolerated well. She reports some "weird feeling" due to known plica, but otherwise feels great. She is planning on participating in the [employer] masters this summer.  History Reviewed: Non smoker.  ROS: Please see HPI above.  Objective: Office vital signs reviewed. BP 119/72  Pulse 52  Ht 5\' 3"  (1.6 m)  Wt 108 lb (48.988 kg)  BMI 19.14 kg/m2  Physical Examination:  General: Awake, alert. NAD HEENT: Atraumatic, normocephalic Right knee: No TTP. No obvious effusion or warmth. Palpable plica on medial aspect of knee. Full flexion and extension without discomfort. Normal ligaments and McMurray testing  Ultrasound of right knee shows full resolution of quadriceps tendon injury.  There is a free fragment off lateral border of superior patella that is unchanged that looks like bone.  No obvious edema or tears of patellar tendon.  Assessment: 43 y.o. female follow up knee pain  Plan: See Problem List and After Visit Summary

## 2014-02-16 NOTE — Patient Instructions (Signed)
Continue to use the full knee sleeve with running, and leave on 10-30 minutes after the run. Continue to do your strengthening exercises at least 3 times per week. Try to do at least 2 days per week on the bike. Work on the training program discussed with Dr. Darrick PennaFields. Wean off the nitroglycerine patch, as tolerated. The patellar tendon looks good today!  [name] [name] M. Fed Ceci, M.D.

## 2014-02-16 NOTE — Assessment & Plan Note (Signed)
Knee pain improved. Able to run without pain. - Continue to wear full knee sleeve to stabilize knee - Add crosstraining on the bike to routine - Discussed training routine for track competition this summer - Can wean off NGT patches - Continue quadriceps strengthening exercises - F/u prn

## 2014-02-17 NOTE — Assessment & Plan Note (Signed)
This has improved with no hyhpoechoic change around the loose body  We will follow and she will recheck prn for pain

## 2015-05-20 IMAGING — CR DG KNEE COMPLETE 4+V*R*
4 series · 4 of 4 positions shown · non-contrast
Comparison: None.

CLINICAL DATA: Right lateral knee pain. Prior history of patellar
subluxation.

EXAM:
RIGHT KNEE - COMPLETE 4+ VIEW

[view not recorded (1 of 4)]
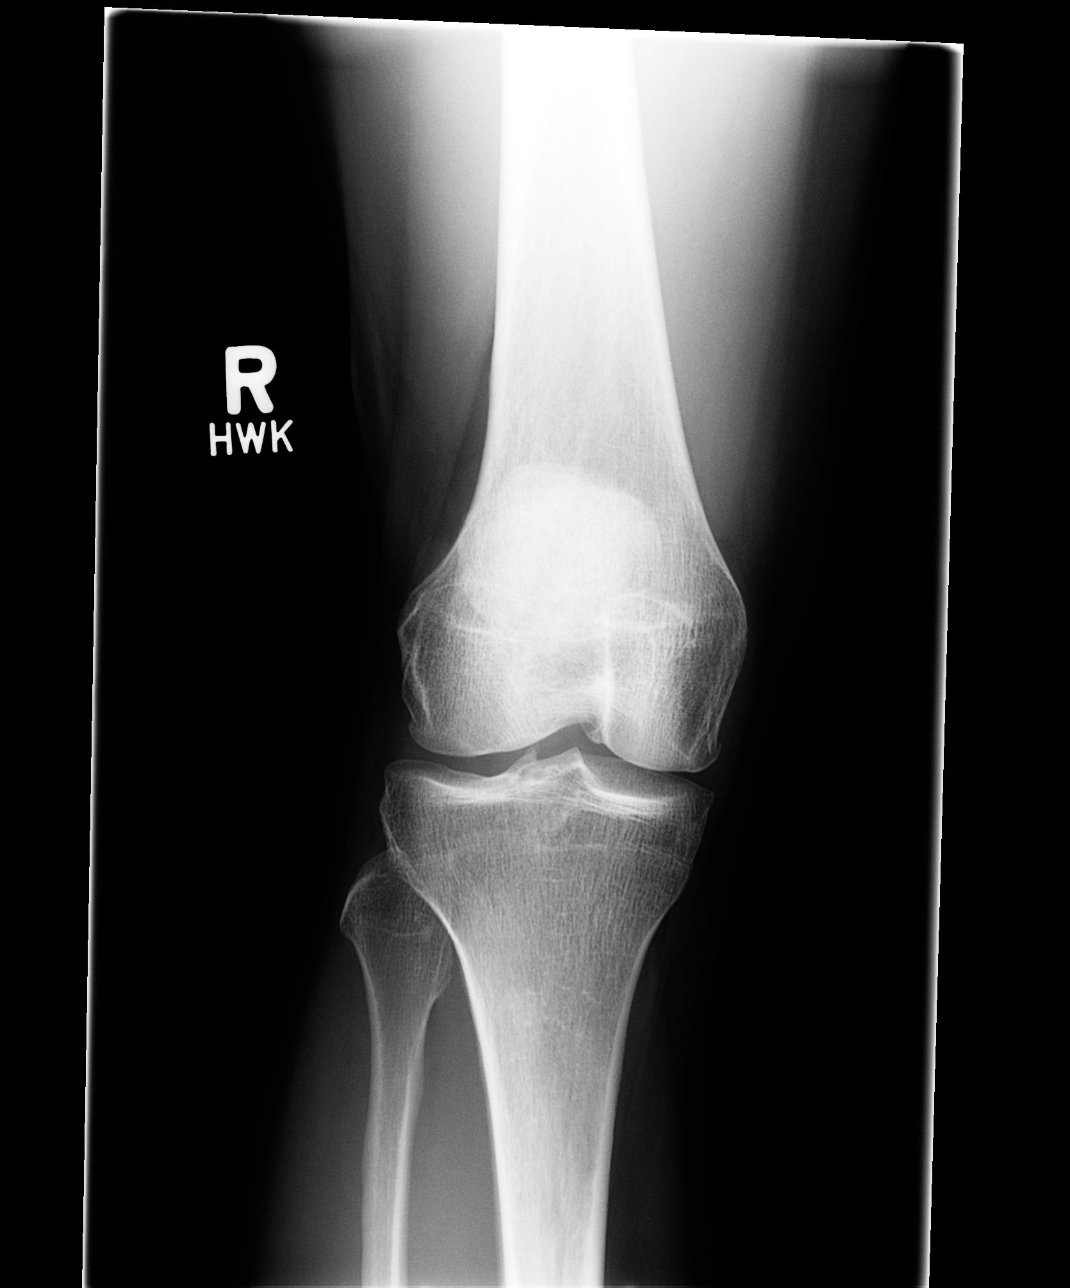

[view not recorded (2 of 4)]
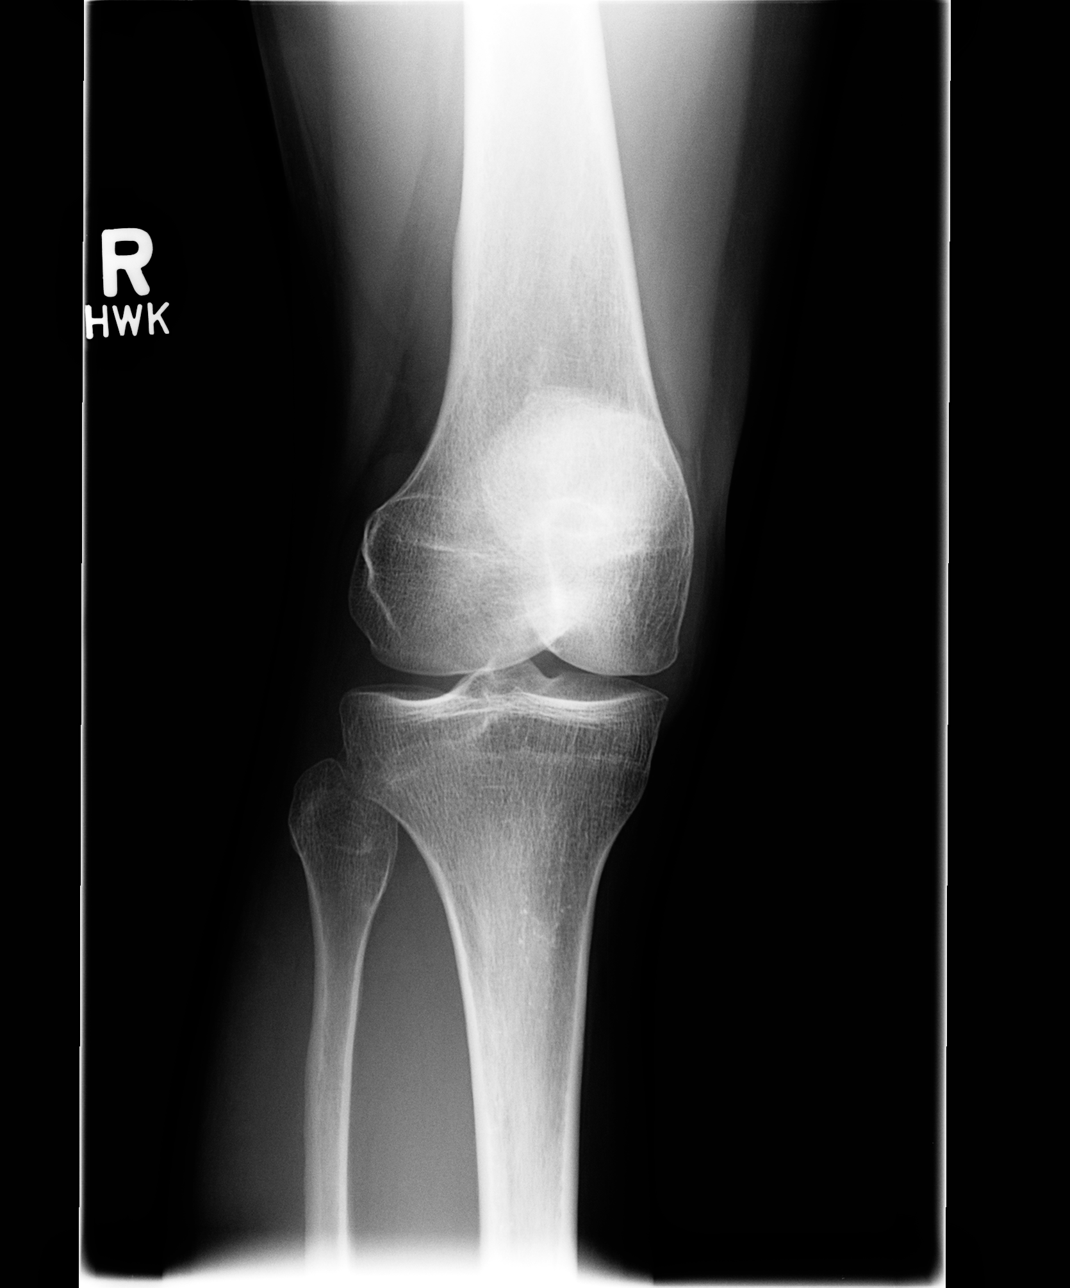

[view not recorded (3 of 4)]
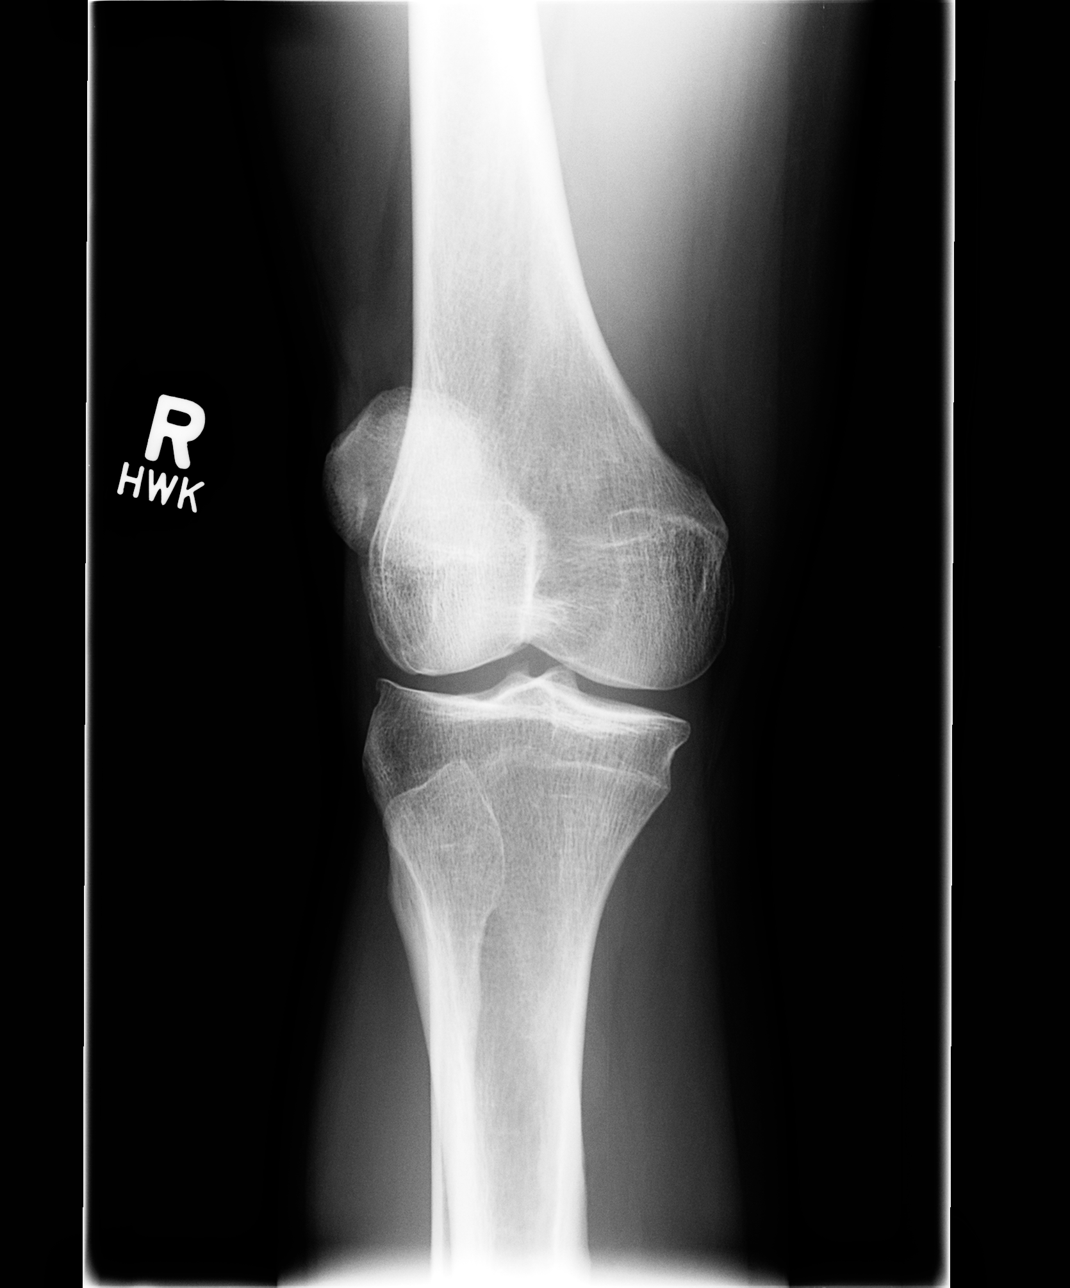

[view not recorded (4 of 4)]
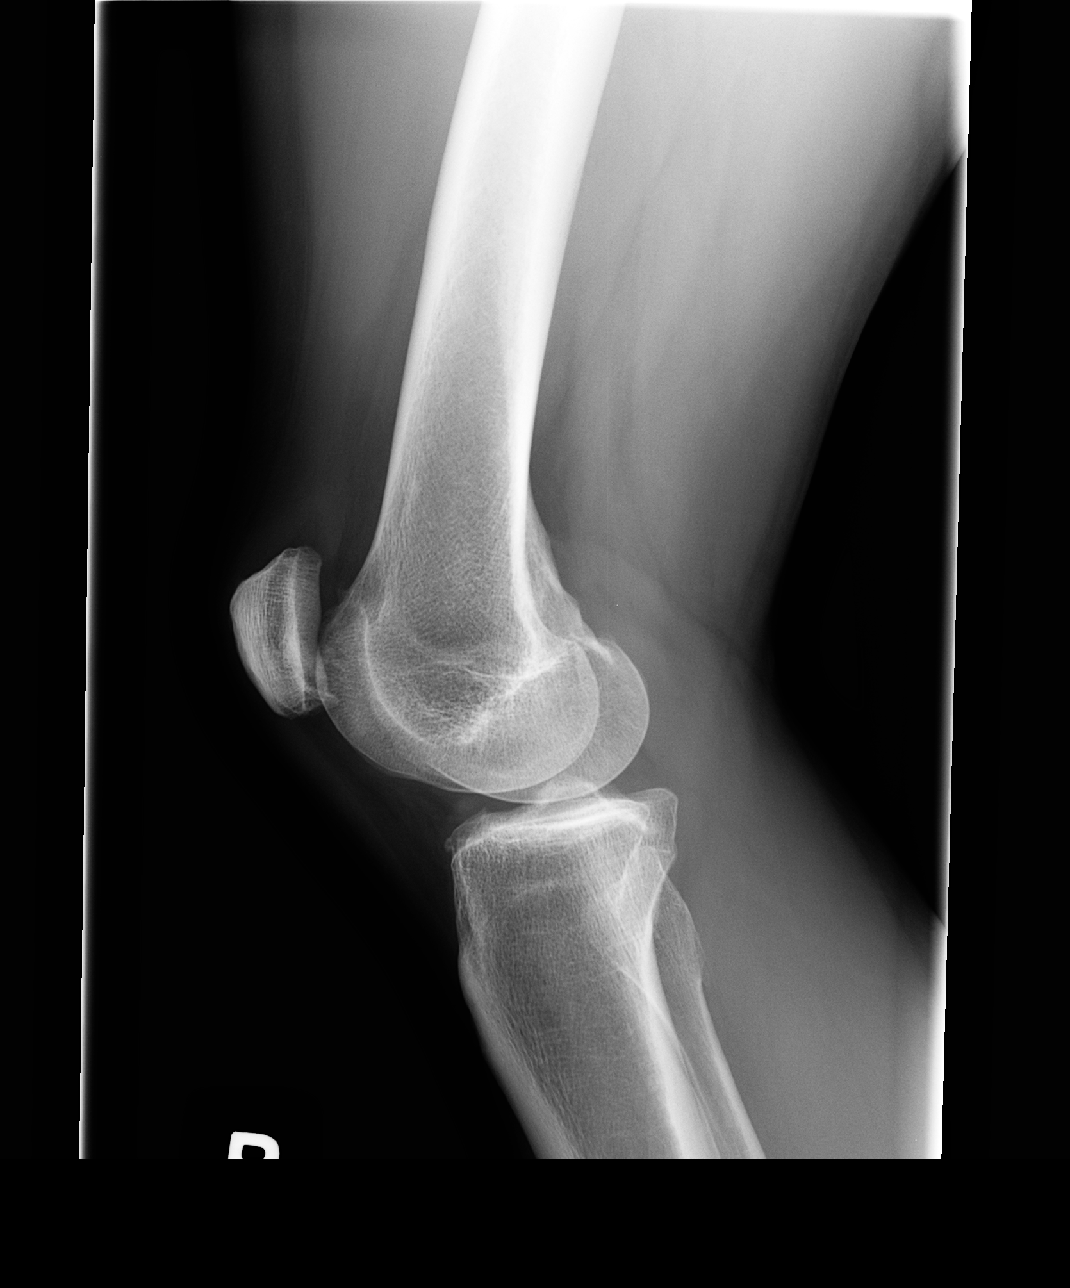

[4 of 4 positions shown; findings below may reference images not displayed]

FINDINGS: No evidence of acute or subacute fracture or dislocation. Perhaps
mild lateral compartment joint space narrowing with a small spur
arising from the lateral tibia. Patellofemoral compartment joint
space well preserved with spurring on the undersurface of the
patella inferiorly. Well preserved bone mineral density. No visible
joint effusion. No calcified loose bodies.
IMPRESSION: 1. No acute or subacute osseous abnormality.
2. Mild lateral compartment osteoarthritis.
3. Mild spurring along the undersurface of the patella.

## 2015-07-12 ENCOUNTER — Encounter: Payer: Self-pay | Admitting: Sports Medicine

## 2015-07-12 ENCOUNTER — Ambulatory Visit (INDEPENDENT_AMBULATORY_CARE_PROVIDER_SITE_OTHER): Payer: BC Managed Care – PPO | Admitting: Sports Medicine

## 2015-07-12 VITALS — BP 115/54 | HR 62 | Ht 64.0 in | Wt 109.0 lb

## 2015-07-12 DIAGNOSIS — M25561 Pain in right knee: Secondary | ICD-10-CM

## 2015-07-12 DIAGNOSIS — M2012 Hallux valgus (acquired), left foot: Secondary | ICD-10-CM | POA: Diagnosis not present

## 2015-07-12 NOTE — Progress Notes (Signed)
  [name] [name] - 44 y.o. female MRN [id number]  Date of birth: [birth year]   [name] [name] is a 44 y.o. female who presents today for bunion of her left foot and right knee pain.   She presents today for evaluation of her bunion. The bunion is occurring on her left foot. She does not remember if any family members have had a prominent bunions as she does now. It is more of an ache that occurs about 45 minutes into a run. It is worse when she wears tighter shoes she has tried using metatarsal pads with limited improvement she is try different ways of losing her shoe to help ease the relief. She rubs her feet constantly at night to help improve ache.  She is also complaining problems with her right knee. She has a history of patellar subluxation in the knee as well. She has been diagnosed with quadriceps tendinitis in that same leg. She wears body helix from time to time but has been having trouble toes the smallest too tight in the medium too big. The pain usually occurs on the lateral aspect of her right knee. She describes it as pressure on the knee. At times she feels like her knee may give out.  PMHx - reviewed.    ROS Per HPI   Exam:  Filed Vitals:   07/12/15 1530  BP: 115/54  Pulse: 62   Gen: NAD Cardiorespiratory - Normal respiratory effort/rate.   Foot Exam:  Laterality: left Appearance: Hallux valgus more noticeable on the left than right  Gait: Normal Edema: None Tenderness: None Range of Motion: Normal active and passive range of motion Laxity: None Neurovascularly intact: Yes Toe shape:   Hammer: None Arch: Neutral rear foot but some loss of longitudinal arch Strength:  Dorsiflexion: 5/5 Plantarflexion: 5/5 Inversion: 5/5 Eversion: 5/5 Knee Exam:  Laterality: right Appearance: symmetric  Palpation: plica appreciated on the latera and medial aspect  Edema: none  Tenderness: none Range of Motion: normal active and passive  Laxity: none  Maneuvers:    McMurray's: negative  Strength:  Quadricep: 5/5 Hamstring: 5/5 Gait: normal    Imaging:  01/06/14: Right knee x-ray: mild lateral compartment OA an spurring under patella.

## 2015-07-13 DIAGNOSIS — M2012 Hallux valgus (acquired), left foot: Secondary | ICD-10-CM | POA: Insufficient documentation

## 2015-07-13 NOTE — Assessment & Plan Note (Signed)
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  - she will f/u for custom orthotics  - may need added metatarsals pads placed  - encourage wide forefoot shoes, may need to skip the distal lace hole in her shoes or make a cut in her shoe overlying the bunion to relieve pressure.

## 2015-07-13 NOTE — Assessment & Plan Note (Signed)
Right knee problems could be associated with hx of patellar subluxation or plica appreciated on exam  Ligament and meniscus testing normal - will try a sports brace body helix since it is a little smaller and the fit can be manipulated more  - she can continue to exercise  - f/u PRN

## 2015-07-28 ENCOUNTER — Ambulatory Visit: Payer: BC Managed Care – PPO | Admitting: Sports Medicine

## 2015-08-10 ENCOUNTER — Ambulatory Visit (INDEPENDENT_AMBULATORY_CARE_PROVIDER_SITE_OTHER): Payer: BC Managed Care – PPO | Admitting: Sports Medicine

## 2015-08-10 VITALS — BP 119/63 | Ht 63.0 in | Wt 108.0 lb

## 2015-08-10 DIAGNOSIS — M25561 Pain in right knee: Secondary | ICD-10-CM | POA: Diagnosis not present

## 2015-08-10 DIAGNOSIS — R269 Unspecified abnormalities of gait and mobility: Secondary | ICD-10-CM | POA: Diagnosis not present

## 2015-08-10 DIAGNOSIS — M2012 Hallux valgus (acquired), left foot: Secondary | ICD-10-CM | POA: Diagnosis not present

## 2015-08-11 NOTE — Assessment & Plan Note (Signed)
Avid runner with left sided hallux valgus, loss of transverse arch,  and b/l loss of her longitudinal arch.   - Custom orthotics construct ed today seem to correct her arch and help to realign her foot.  - No metatarsal pads added as she doesn't seem to have a metatarsalgia - encourage wide forefoot shoes, may need to skip the distal lace hole in her shoes or make a cut in her shoe overlying the bunion to relieve pressure.  - f/u PRN

## 2015-08-11 NOTE — Progress Notes (Signed)
  [name] [name] - 44 y.o. 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 She has a bunion occurring on her left foot.  It is more of an ache that occurs about 45 minutes into a run. This was evaluated last month when she presented for knee pain. It is worse when she wears tighter shoes she has tried using metatarsal pads with limited improvement she is try different ways of losing her shoe to help ease the relief. She rubs her feet constantly at night to help improve ache.  ROS:     14 point RoS negative other than that listed in HPI.  HISTORY: Past Medical, Surgical, Social, and Family History Reviewed & Updated per EMR.  Pertinent Historical Findings include: No updates  OBJECTIVE: BP 119/63 mmHg  Ht  (1.6 m)  Wt 108 lb (48.988 kg)  BMI 19.14 kg/m2  Physical Exam  Gen: NAD Cardiorespiratory - Normal respiratory effort/rate.  Foot Exam:  Laterality: left Appearance: Hallux valgus more noticeable on the left than right  Gait: Normal Edema: None Tenderness: None Range of Motion: Normal active and passive range of motion Laxity: None Neurovascularly intact: Yes Arch: Neutral rear foot but some loss of longitudinal arch & mild calcaneus valgus Strength:  Dorsiflexion: 5/5 Plantarflexion: 5/5 Inversion: 5/5 Eversion: 5/5  Patient was fitted for a : standard, cushioned, semi-rigid orthotic. The orthotic was heated and afterward the patient stood on the orthotic blank positioned on the orthotic stand. The patient was positioned in subtalar neutral position and 10 degrees of ankle dorsiflexion in a weight bearing stance. After completion of molding, a stable base was applied to the orthotic blank. The blank was ground to a stable position for weight bearing. Size: 6-7 Base:Blue EVA  Posting:none Additional orthotic padding: none  More than 40 minutes were spent with  the patient, more than half of which was spent in direct construction & revision of her orthotics.   MEDICATIONS, LABS & OTHER ORDERS: Previous Medications   ALAYCEN 1/35 TABLET       NITROGLYCERIN (NITRODUR - DOSED IN MG/24 HR) 0.2 MG/HR PATCH    Place 1 patch (0.2 mg total) onto the skin daily.   NORETHINDRONE-ETHINYL ESTRADIOL BIPHASIC (NECON 10/11) 0.5-35/1-35 MG-MCG TABLET    Take 1 tablet by mouth daily.   Modified Medications   No medications on file   New Prescriptions   No medications on file   Discontinued Medications   No medications on file  No orders of the defined types were placed in this encounter.   ASSESSMENT & PLAN: See problem based charting & AVS for pt instructions.

## 2015-08-30 ENCOUNTER — Encounter: Payer: BC Managed Care – PPO | Admitting: Sports Medicine

## 2016-05-15 ENCOUNTER — Ambulatory Visit (INDEPENDENT_AMBULATORY_CARE_PROVIDER_SITE_OTHER): Payer: BC Managed Care – PPO | Admitting: Sports Medicine

## 2016-05-15 ENCOUNTER — Encounter: Payer: Self-pay | Admitting: Sports Medicine

## 2016-05-15 VITALS — BP 125/58 | HR 55 | Ht 64.0 in | Wt 110.0 lb

## 2016-05-15 DIAGNOSIS — M7632 Iliotibial band syndrome, left leg: Secondary | ICD-10-CM

## 2016-05-15 DIAGNOSIS — M7630 Iliotibial band syndrome, unspecified leg: Secondary | ICD-10-CM | POA: Insufficient documentation

## 2016-05-15 NOTE — Progress Notes (Signed)
Patient ID: [name] [name], female   DOB: 07/27/1971, 45 y.o.   MRN: [id number]  CC. Left ITB pain  Patient has had trouble with ITB in past but on RT side Recently changed bike shoes Doing more bike training as she plans to ride [place]  Running has been going well with not much pain  Left knee started feeling tight Sharp lateral pain at times Not stopping her from training  Friend felt she was off center on bike and she has had some fit advice.  With new shoes somewhat less pain  Running workouts - only some sharp pain but up to 6/10  Fsm Hx:  Married with 3 children\  RoS No locking No giving way ( this has happened on RT in past) No swelling or redness  Pexam Thin F NAD BP 125/58 mmHg  Pulse 55  Ht 5\' 4"  (1.626 m)  Wt 110 lb (49.896 kg)  BMI 18.87 kg/m2  Lt Knee: Normal to inspection with no erythema or effusion or obvious bony abnormalities. Palpation normal with no warmth or joint line tenderness or patellar tenderness or condyle tenderness. ROM normal in flexion and extension and lower leg rotation. Ligaments with solid consistent endpoints including ACL, PCL, LCL, MCL. Negative Mcmurray's and provocative meniscal tests. Non painful patellar compression. Patellar and quadriceps tendons unremarkable. Hamstring and quadriceps strength is normal.  Hip abd strength is excellent  Note on Noble's test with pressure I elicit mild pain over ITB not felt on RT  Bike position Turning left foot inward and shifting slt to left

## 2016-05-15 NOTE — Assessment & Plan Note (Signed)
I think this is being triggered by short leg with abnormal position on Bike  Add a lift to bike insole Keep lift in running shoe  Add some HEP to be sure keeping hip and core strong  Reck if not resolving w changes

## 2016-11-05 ENCOUNTER — Ambulatory Visit (INDEPENDENT_AMBULATORY_CARE_PROVIDER_SITE_OTHER): Payer: BC Managed Care – PPO | Admitting: Family Medicine

## 2016-11-05 VITALS — BP 118/70 | HR 67 | Temp 99.1°F | Resp 17 | Ht 64.0 in | Wt 115.0 lb

## 2016-11-05 DIAGNOSIS — R05 Cough: Secondary | ICD-10-CM | POA: Diagnosis not present

## 2016-11-05 DIAGNOSIS — B001 Herpesviral vesicular dermatitis: Secondary | ICD-10-CM | POA: Diagnosis not present

## 2016-11-05 DIAGNOSIS — R0981 Nasal congestion: Secondary | ICD-10-CM

## 2016-11-05 DIAGNOSIS — R059 Cough, unspecified: Secondary | ICD-10-CM

## 2016-11-05 MED ORDER — AZITHROMYCIN 250 MG PO TABS: ORAL_TABLET | ORAL | 0 refills | Status: DC

## 2016-11-05 MED ORDER — HYDROCOD POLST-CPM POLST ER 10-8 MG/5ML PO SUER: 5.0000 mL | Freq: Two times a day (BID) | ORAL | 0 refills | Status: DC | PRN

## 2016-11-05 NOTE — Patient Instructions (Addendum)
Abreva over the counter for oral lesion.  Tussionex 5 ml at night for cough.  If symptoms do not improve by Saturday, you may fill prescription for Azithromycin.  IF you received an x-ray today, you will receive an invoice from [facility]. Please contact [facility] at [phone] with questions or concerns regarding your invoice.   IF you received labwork today, you will receive an invoice from [employer]. Please contact [facility] at [phone] with questions or concerns regarding your invoice.   Our billing staff will not be able to assist you with questions regarding bills from these companies.  You will be contacted with the lab results as soon as they are available. The fastest way to get your results is to activate your My Chart account. Instructions are located on the last page of this paperwork. If you have not heard from [place] regarding the results in 2 weeks, please contact this office.      Upper Respiratory Infection, Adult Most upper respiratory infections (URIs) are a viral infection of the air passages leading to the lungs. A URI affects the nose, throat, and upper air passages. The most common type of URI is nasopharyngitis and is typically referred to as "the common cold." URIs run their course and usually go away on their own. Most of the time, a URI does not require medical attention, but sometimes a bacterial infection in the upper airways can follow a viral infection. This is called a secondary infection. Sinus and middle ear infections are common types of secondary upper respiratory infections. Bacterial pneumonia can also complicate a URI. A URI can worsen asthma and chronic obstructive pulmonary disease (COPD). Sometimes, these complications can require emergency medical care and may be life threatening. What are the causes? Almost all URIs are caused by viruses. A virus is a type of germ and can spread from one person to  another. What increases the risk? You may be at risk for a URI if:  You smoke.  You have chronic heart or lung disease.  You have a weakened defense (immune) system.  You are very young or very old.  You have nasal allergies or asthma.  You work in crowded or poorly ventilated areas.  You work in health care facilities or schools. What are the signs or symptoms? Symptoms typically develop 2-3 days after you come in contact with a cold virus. Most viral URIs last 7-10 days. However, viral URIs from the influenza virus (flu virus) can last 14-18 days and are typically more severe. Symptoms may include:  Runny or stuffy (congested) nose.  Sneezing.  Cough.  Sore throat.  Headache.  Fatigue.  Fever.  Loss of appetite.  Pain in your forehead, behind your eyes, and over your cheekbones (sinus pain).  Muscle aches. How is this diagnosed? Your health care provider may diagnose a URI by:  Physical exam.  Tests to check that your symptoms are not due to another condition such as:  Strep throat.  Sinusitis.  Pneumonia.  Asthma. How is this treated? A URI goes away on its own with time. It cannot be cured with medicines, but medicines may be prescribed or recommended to relieve symptoms. Medicines may help:  Reduce your fever.  Reduce your cough.  Relieve nasal congestion. Follow these instructions at home:  Take medicines only as directed by your health care provider.  Gargle warm saltwater or take cough drops to comfort your throat as directed by your health care provider.  Use  a warm mist humidifier or inhale steam from a shower to increase air moisture. This may make it easier to breathe.  Drink enough fluid to keep your urine clear or pale yellow.  Eat soups and other clear broths and maintain good nutrition.  Rest as needed.  Return to work when your temperature has returned to normal or as your health care provider advises. You may need to stay  home longer to avoid infecting others. You can also use a face mask and careful hand washing to prevent spread of the virus.  Increase the usage of your inhaler if you have asthma.  Do not use any tobacco products, including cigarettes, chewing tobacco, or electronic cigarettes. If you need help quitting, ask your health care provider. How is this prevented? The best way to protect yourself from getting a cold is to practice good hygiene.  Avoid oral or hand contact with people with cold symptoms.  Wash your hands often if contact occurs. There is no clear evidence that vitamin C, vitamin E, echinacea, or exercise reduces the chance of developing a cold. However, it is always recommended to get plenty of rest, exercise, and practice good nutrition. Contact a health care provider if:  You are getting worse rather than better.  Your symptoms are not controlled by medicine.  You have chills.  You have worsening shortness of breath.  You have brown or red mucus.  You have yellow or brown nasal discharge.  You have pain in your face, especially when you bend forward.  You have a fever.  You have swollen neck glands.  You have pain while swallowing.  You have white areas in the back of your throat. Get help right away if:  You have severe or persistent:  Headache.  Ear pain.  Sinus pain.  Chest pain.  You have chronic lung disease and any of the following:  Wheezing.  Prolonged cough.  Coughing up blood.  A change in your usual mucus.  You have a stiff neck.  You have changes in your:  Vision.  Hearing.  Thinking.  Mood. This information is not intended to replace advice given to you by your health care provider. Make sure you discuss any questions you have with your health care provider. Document Released: 05/15/2001 Document Revised: 07/22/2016 Document Reviewed: 02/24/2014 Elsevier Interactive Patient Education  2017 Elsevier Inc.  Cold  Sore Introduction A cold sore, also called a fever blister, is a skin infection that is caused by a virus. This infection causes small, fluid-filled sores to form inside of the mouth or on the lips, gums, nose, chin, or cheeks. Cold sores can spread to other parts of the body, such as the eyes or fingers. Cold sores can be spread or passed from person to person (contagious) until the sores crust over completely. Cold sores can be spread through close contact, such as kissing or sharing a drinking glass. Follow these instructions at home: Medicines  Take or apply over-the-counter and prescription medicines only as told by your doctor.  Use a cotton-tip swab to apply creams or gels to your sores. Sore Care  Do not touch the sores or pick the scabs.  Wash your hands often. Do not touch your eyes without washing your hands first.  Keep the sores clean and dry.  If directed, apply ice to the sores:  Put ice in a plastic bag.  Place a towel between your skin and the bag.  Leave the ice on for 20 minutes,  2-3 times per day. Lifestyle  Do not kiss, have oral sex, or share personal items until your sores heal.  Eat a soft, bland diet. Avoid eating hot, cold, or salty foods. These can hurt your mouth.  Use a straw if it hurts to drink out of a glass.  Avoid the sun and limit your stress if these things trigger outbreaks. If sun causes cold sores, apply sunscreen on your lips before being out in the sun. Contact a doctor if:  You have symptoms for more than two weeks.  You have pus coming from the sores.  You have redness that is spreading.  You have pain or irritation in your eye.  You get sores on your genitals.  Your sores do not heal within two weeks.  You get cold sores often. Get help right away if:  You have a fever and your symptoms suddenly get worse.  You have a headache and confusion. This information is not intended to replace advice given to you by your health  care provider. Make sure you discuss any questions you have with your health care provider. Document Released: 05/20/2012 Document Revised: 04/26/2016 Document Reviewed: 09/09/2015  2017 Elsevier

## 2016-11-05 NOTE — Progress Notes (Signed)
Patient ID: [name] LoronHollis [name], female    DOB: 01/23/1971, 45 y.o.   MRN: [id number]  PCP: No primary care provider on file.  Chief Complaint  Patient presents with  . Blister    on lip   . Sinusitis    Subjective:   HPI 45 year old female presents for evaluation of blister and sinus congestion x 1 week. [facility] although today she presents as a new patient to me. Concerned regarding the developed of a painless lesion on her upper inner lip that appeared to be a blood clot x 3 days ago. Reports that her upper lip was swollen initially, swelling subsided, lesion increased in size. Lesion is painless and nondraining. Pt also reports 1 week hx of coughing, enlarged lymph nodes, upper nasal congestion, and bilateral ear stuffiness. Coughing is worse at night due to contact post nasal drip which has caused her to develop hoarseness of voice.  Denies fever, headache, facial pain, nausea, or vomiting.  Social History   Social History  . Marital status: Single    Spouse name: N/A  . Number of children: N/A  . Years of education: N/A   Occupational History  . Not on file.   Social History Main Topics  . Smoking status: Never Smoker  . Smokeless tobacco: Never Used  . Alcohol use Not on file  . Drug use: Unknown  . Sexual activity: Not on file   Other Topics Concern  . Not on file   Social History Narrative  . No narrative on file    No family history on file. Review of Systems See Hpi    Patient Active Problem List   Diagnosis Date Noted  . Iliotibial band syndrome 05/15/2016  . Hallux valgus of left foot 07/13/2015  . Quadriceps tendinitis 12/15/2013  . Abnormal gait 05/27/2012  . Knee pain, right 03/05/2012  . Patellar subluxation 03/05/2012     Prior to Admission medications   Medication Sig Start Date End Date Taking? Authorizing Provider  norethindrone-ethinyl estradiol (JUNEL FE,GILDESS FE,LOESTRIN FE) 1-20 MG-MCG tablet Take 1  tablet by mouth daily.   Yes Historical Provider, MD   No Known Allergies     Objective:  Physical Exam  Constitutional: She is oriented to person, place, and time. She appears well-developed and well-nourished.  HENT:  Head: Normocephalic and atraumatic.  Right Ear: External ear normal.  Left Ear: External ear normal.  Eyes: Conjunctivae are normal. Pupils are equal, round, and reactive to light.  Neck: Normal range of motion. Neck supple.  Cardiovascular: Normal rate, regular rhythm, normal heart sounds and intact distal pulses.   Pulmonary/Chest: Effort normal and breath sounds normal.  Musculoskeletal: Normal range of motion.  Lymphadenopathy:    She has no cervical adenopathy.  Neurological: She is alert and oriented to person, place, and time.  Skin: Skin is warm and dry.  Psychiatric: She has a normal mood and affect. Her behavior is normal. Judgment and thought content normal.    Vitals:   11/05/16 1137  BP: 118/70  Pulse: 67  Resp: 17  Temp: 99.1 F (37.3 C)     Assessment & Plan:  1. Sinus congestion 2. Cold sore 3. Cough  Plan: -For cough, chlorpheniramine-hydrocodone (Tussionex) 5 ml every 12 hours. Medication causes drowsiness and or sedation. -If symptoms have not improved within 5 days, fill prescription for Azithromycin Take 2 tabs x 1 dose, then 1 tab every day for x 4 days  -Purchase over  the counter Abreva and apply as directed to oral lesion.  Return for follow-up as needed.  Godfrey PickKimberly S. Tiburcio PeaHarris, MSN, FNP-C [facility]

## 2017-03-12 ENCOUNTER — Encounter: Payer: Self-pay | Admitting: Sports Medicine

## 2017-03-12 ENCOUNTER — Ambulatory Visit (INDEPENDENT_AMBULATORY_CARE_PROVIDER_SITE_OTHER): Payer: BC Managed Care – PPO | Admitting: Sports Medicine

## 2017-03-12 ENCOUNTER — Ambulatory Visit: Payer: Self-pay

## 2017-03-12 VITALS — BP 115/63 | Ht 63.0 in | Wt 112.0 lb

## 2017-03-12 DIAGNOSIS — M25562 Pain in left knee: Secondary | ICD-10-CM

## 2017-03-12 DIAGNOSIS — G8929 Other chronic pain: Secondary | ICD-10-CM

## 2017-03-12 DIAGNOSIS — M25561 Pain in right knee: Secondary | ICD-10-CM

## 2017-03-12 NOTE — Progress Notes (Signed)
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 Palpation normal with no warmth or joint line tenderness or patellar tenderness or condyle tenderness. ROM normal in flexion and extension and lower leg rotation. Ligaments with solid consistent endpoints including ACL, PCL, LCL, MCL. Negative Mcmurray's and provocative meniscal tests. Non painful patellar compression. Patellar and quadriceps tendons unremarkable. Hamstring and quadriceps strength is normal. Mild crepitation on flexion over ITB at Gerdy's tubercle and below  Knee: Normal to inspection except mild effusion  No obvious bony abnormalities. Palpation normal with no warmth or joint line tenderness or patellar tenderness or condyle tenderness. ROM normal in flexion and extension and lower leg rotation. Ligaments with solid consistent endpoints including ACL, PCL, LCL, MCL. Negative Mcmurray's and provocative meniscal tests. Non painful patellar compression. Patellar and quadriceps tendons unremarkable. Hamstring and quadriceps strength is normal.  All strength tests Quad, abduction, adduction and HS were normal  Ultrasound of Knees  Right knee shows a small spur on lateral joint line This impinges on ITB Possible thickening of latera; plica Patellar and quadriceps tendons normal Meniscii are normal No effusion  Left  knee Small effusion present Medial and lateral meniscus normal Patellar and quad tendons normal Mild swelling noted behind semitendinosus No Baker's cyst No spurring  Impression ; Small effusion of left knee with no noted structural abnormality Right knee does show small spur of lateral joint line  Ultrasound and interpretation by Sibyl Parr. Darrick Penna, MD

## 2017-03-12 NOTE — Assessment & Plan Note (Signed)
Has lateral joint line spur that I think impinges on ITB  Cont on a HEP  Uses compression sleeve  reassured that no other findings

## 2017-03-12 NOTE — Assessment & Plan Note (Signed)
Looks more acute  Use icing regimen Use compression after running  Keep up HEP

## 2017-11-07 ENCOUNTER — Ambulatory Visit: Payer: BC Managed Care – PPO | Admitting: Sports Medicine

## 2017-11-07 ENCOUNTER — Encounter: Payer: Self-pay | Admitting: Sports Medicine

## 2017-11-07 DIAGNOSIS — G8929 Other chronic pain: Secondary | ICD-10-CM | POA: Diagnosis not present

## 2017-11-07 DIAGNOSIS — M25561 Pain in right knee: Secondary | ICD-10-CM

## 2017-11-07 DIAGNOSIS — Z8739 Personal history of other diseases of the musculoskeletal system and connective tissue: Secondary | ICD-10-CM | POA: Insufficient documentation

## 2017-11-07 DIAGNOSIS — M2341 Loose body in knee, right knee: Secondary | ICD-10-CM

## 2017-11-07 MED ORDER — NAPROXEN 500 MG PO TABS: 500.0000 mg | ORAL_TABLET | Freq: Two times a day (BID) | ORAL | 0 refills | Status: AC | PRN

## 2017-11-07 NOTE — Progress Notes (Signed)
HPI  CC: Right knee pain [name] [name] is here with new complaints of right-sided knee pain.  She states that approximately 5 days ago she had been warming up for a trailer when she felt an acute onsets joint line pain with difficulty fully extending her knee.  This pain persisted for approximately 24 hours only to spontaneously resolve the following day.  She denies any significant swelling or erythema around this knee.  She had no weakness, numbness, or paresthesias.  She states that events similar to this have happened in the past but this was the longest duration she had experienced it.  [name] [name] is not experiencing significant pain today.  Of note [name] [name] has a long history of bilateral patellar subluxations/dislocations. (These current events are described as to similar from prior patellar injuries.)  Medications/Interventions Tried: None  See HPI and/or previous note for associated ROS.  Objective: BP 100/66   Ht 5\' 3"  (1.6 m)   Wt 110 lb (49.9 kg)   BMI 19.49 kg/m  Gen: NAD, well groomed, a/o x3, normal affect.  CV: Well-perfused. Warm.  Resp: Non-labored.  Neuro: Sensation intact throughout. No gross coordination deficits.  Gait: Nonpathologic posture, unremarkable stride without signs of limp or balance issues. Knee, Right: TTP noted very mildly along the medial and lateral joint lines. Inspection was negative for erythema, ecchymosis, and effusion. No obvious bony abnormalities or signs of osteophyte development. Palpation yielded no asymmetric warmth; No condyle tenderness; No patellar tenderness; No patellar crepitus. Patellar and quadriceps tendons unremarkable, and no tenderness of the pes anserine bursa. No obvious Baker's cyst development. ROM normal in flexion (135 degrees) and extension (0 degrees). Normal hamstring and quadriceps strength. Neurovascularly intact bilaterally.  - Ligaments: (Solid and consistent endpoints)   - ACL (present bilaterally)   - PCL (present  bilaterally)   - LCL (present bilaterally)   - MCL (present bilaterally).   - Additional tests performed:    - Anterior Drawer >> NEG   - Lachman >> NEG  - Meniscus:   - Thessaly: NEG  - Patella:   - Patellar grind/compression: NEG   - Patellar glide: Without apprehension >> however, bilaterally hypermobile patella  ULTRASOUND: Knee, Right Diagnostic limited ultrasound imaging obtained of [name] [name]'s Right knee.  - Quadriceps tendon: No appreciated signs of tearing, edema, or calcification. - Suprapatellar pouch: Increased fluid accumulation noted with obvious loose body calcifications present.  Fluid within the suprapatellar pouch with starry night appearance consistent with a crystallization. - Patellar tendon: No appreciated signs of tearing, edema, or calcification. No infrapatellar or tibial tuberosity fluid or abnormality appreciated.  - Medial joint line: No signs concerning for meniscal pathology appreciated. No increased fluid presence noted. No evidence of osteophyte development or significant joint space loss.  - Lateral joint line: No signs concerning for meniscal pathology appreciated. No increased fluid presence noted. No evidence of osteophyte development or significant joint space loss.    Assessment and plan:  Hx of calcium pyrophosphate deposition disease (CPPD) Ultrasound evidence of CPPD noted today.  This was not shown on previous examinations.  This is likely a reactionary event to her prior patellar trauma and current loose body development. -Naproxen 500 mg twice daily as needed for flares. -Continue compression  Loose body in knee, right Obvious signs of loose body accumulation residing within the suprapatellar pouch of the right knee.  This was not seen on the left knee.  These are likely accumulations from prior patellar subluxation/dislocations. -Discussed symptomatic management at this time. -NSAIDs  and compression as needed -RICE therapy as needed  Next:  Future consideration for additional imaging and possible surgical intervention if symptoms become significantly worse and/or persistent.   Meds ordered this encounter  Medications  . naproxen (NAPROSYN) 500 MG tablet    Sig: Take 1 tablet (500 mg total) by mouth 2 (two) times daily as needed.    Dispense:  60 tablet    Refill:  0     Kathee DeltonIan D McKeag, MD,MS [facility] Fellow 11/07/2017 1:49 PM

## 2017-11-07 NOTE — Assessment & Plan Note (Signed)
Ultrasound evidence of CPPD noted today.  This was not shown on previous examinations.  This is likely a reactionary event to her prior patellar trauma and current loose body development. -Naproxen 500 mg twice daily as needed for flares. -Continue compression

## 2017-11-07 NOTE — Assessment & Plan Note (Signed)
Obvious signs of loose body accumulation residing within the suprapatellar pouch of the right knee.  This was not seen on the left knee.  These are likely accumulations from prior patellar subluxation/dislocations. -Discussed symptomatic management at this time. -NSAIDs and compression as needed -RICE therapy as needed  Next: Future consideration for additional imaging and possible surgical intervention if symptoms become significantly worse and/or persistent.

## 2017-11-11 ENCOUNTER — Ambulatory Visit: Payer: BC Managed Care – PPO | Admitting: Sports Medicine

## 2018-04-29 ENCOUNTER — Ambulatory Visit: Payer: BC Managed Care – PPO | Admitting: Sports Medicine

## 2018-04-29 ENCOUNTER — Encounter: Payer: Self-pay | Admitting: Sports Medicine

## 2018-04-29 ENCOUNTER — Ambulatory Visit
Admission: RE | Admit: 2018-04-29 | Discharge: 2018-04-29 | Disposition: A | Discharge status: Home / Self Care | Payer: BC Managed Care – PPO | Source: Ambulatory Visit | Attending: Sports Medicine | Admitting: Sports Medicine

## 2018-04-29 VITALS — BP 102/68 | Ht 63.0 in | Wt 110.0 lb

## 2018-04-29 DIAGNOSIS — M1731 Unilateral post-traumatic osteoarthritis, right knee: Secondary | ICD-10-CM | POA: Diagnosis not present

## 2018-04-29 DIAGNOSIS — G8929 Other chronic pain: Secondary | ICD-10-CM

## 2018-04-29 DIAGNOSIS — M25561 Pain in right knee: Secondary | ICD-10-CM | POA: Diagnosis not present

## 2018-04-29 NOTE — Assessment & Plan Note (Signed)
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 Likely needs arthroscopy and will discuss with patient.

## 2018-04-29 NOTE — Progress Notes (Signed)
HPI  CC: Right knee pain and swelling  Patient presents today with 2 weeks of right knee pain and swelling. She reports she was in her usual state of health until 2 weeks ago when she developed pain in the right knee without a known trigger. She runs regularly and had not increased her distance or changed her exercise regimen in any way. She underwent five days of ice, elevation and naproxen with improvement in symptoms. Subsequently she tolerated a 6 mile run without difficulty, however one week ago developed worsening knee pain and swelling after completing a track workout running quarters.   For the past week she has avoided running but has noted inability to fully extend the knee, tightness with knee flexion. Pain has been medial and over the patellar tendon. Pain and swelling have improved. She reports that she was limping on the leg until today. She continues to have limited ROM with inability to fully extend the leg at the knee.  In the past, the patient has had subluxation of the LEFT knee with arthroscopy and lateral release when she was in high school.  For her RIGHT knee, she has had pain and swelling in the past thought to be secondary to small chondral defects which have occasionally caused swelling and pain in the right knee (twice per year).   ROS: Per HPI; in addition no fever, no rash, no additional weakness, no additional numbness, no additional paresthesias, and no additional falls/injury.   Objective: BP 102/68   Ht  (1.6 m)   Wt 110 lb (49.9 kg)   BMI 19.49 kg/m  Gen: NAD, well groomed, a/o x3, normal affect.  CV: Well-perfused. Warm.  Resp: Non-labored.  Neuro: No gross coordination deficits.  Right Knee: Normal to inspection with no erythema or effusion or obvious bony abnormalities. Palpation normal with no warmth, joint line tenderness, patellar tenderness, or condyle tenderness. +"tightness" to palpation on medial and lower aspects of patella. Decreased  ROM in extension(cannot extend past 10 degrees)  full ROM in flexion  Ligaments with solid consistent endpoints including ACL, PCL, LCL, MCL. Negative Mcmurray's, Apley's, and Thessalonian tests. Non painful patellar compression. Patellar glide without crepitus, +crepitations noted with rotation of the leg at the knee Patellar and quadriceps tendons unremarkable. Hamstring and quadriceps strength is normal.   Right Knee Ultrasound - completed in office today. Notable for Right suprapatellar pouch medial loose body consistent with osteochondral defect and associated joint effusion. Normal medial and lateral meniscus visualized. Normal patellar tendon visualized.  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  Ultrasound consistent with exam findings with decreased extension at the knee and history of swelling and tightness after a sprint workout that would require full extension at the knee. Likely the osteochondral loose body lodged in a way that caused irritation, swelling, and decreased ROM.  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  In addition there is a smaller loose body in  post. Lateral joint.  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  Note reviewed and modified by me. Enid Baas, MD

## 2018-05-02 NOTE — Addendum Note (Signed)
Addended by: Rutha BouchardBABNIK, Shonette Rhames E on: 05/02/2018 08:38 AM   Modules accepted: Orders

## 2019-01-01 ENCOUNTER — Ambulatory Visit: Payer: BC Managed Care – PPO | Admitting: Sports Medicine

## 2019-01-01 ENCOUNTER — Encounter: Payer: Self-pay | Admitting: Sports Medicine

## 2019-01-01 ENCOUNTER — Encounter

## 2019-01-01 DIAGNOSIS — M7712 Lateral epicondylitis, left elbow: Secondary | ICD-10-CM | POA: Diagnosis not present

## 2019-01-01 DIAGNOSIS — M2341 Loose body in knee, right knee: Secondary | ICD-10-CM

## 2019-01-01 DIAGNOSIS — M1731 Unilateral post-traumatic osteoarthritis, right knee: Secondary | ICD-10-CM

## 2019-01-01 NOTE — Assessment & Plan Note (Signed)
Given protocol for HEP Ice massage Can add NTG if not responding Reck if not better in 6 weeks

## 2019-01-01 NOTE — Progress Notes (Signed)
CC: Left elbow pain  With lifting started getting some lateral elbow and forearm pain on left Hurts to hold anything heavy Hurts to turn door knob or grip Left hand dominant Pain usually midl but has not improved with rest  Prob 2 OCD lesions in left knee These were surgically removed in June/ meniscus resection and lateral release Since then knee pain is much improved Much less stiff Able to run with no real pain  ROS No neck pain  No cervical radicular sxs Knee with no locking or swelling  Gen: Thin F in NAD BP 120/68   Ht 5\' 3"  (1.6 m)   Wt 112 lb (50.8 kg)   BMI 19.84 kg/m   Knee: Left Normal to inspection with no erythema or effusion or obvious bony abnormalities. Small arthrosciopy scars Palpation normal with no warmth or joint line tenderness or patellar tenderness or condyle tenderness. ROM normal in flexion and extension and lower leg rotation. Ligaments with solid consistent endpoints including ACL, PCL, LCL, MCL. Negative Mcmurray's and provocative meniscal tests. Non painful patellar compression. Patellar and quadriceps tendons unremarkable. Hamstring and quadriceps strength is normal.  Running gait looks neutral with no limp  Left leg 1 cm shorter  Feet with mild bunion change bilat  Left elbow TTP lat epiconduyle Pain on resisted finger extension Pain on resisted wrist ext + book test Full ROM

## 2019-01-01 NOTE — Assessment & Plan Note (Signed)
S/P removal of OCD lesions Doing very well Keep up HEP for quad and hip strength

## 2019-01-06 ENCOUNTER — Ambulatory Visit: Payer: BC Managed Care – PPO | Admitting: Sports Medicine

## 2019-09-10 IMAGING — DX DG KNEE AP/LAT W/ SUNRISE*R*
3 series · 3 of 3 positions shown · non-contrast
Comparison: 01/06/2014 plain film exam.

CLINICAL DATA: With right 46-year-old female lateral knee swelling
for 3 weeks. No injury. Initial encounter.

EXAM:
RIGHT KNEE 3 VIEWS

[dg knee ap/lat w/ sunrise right (1 of 3)]
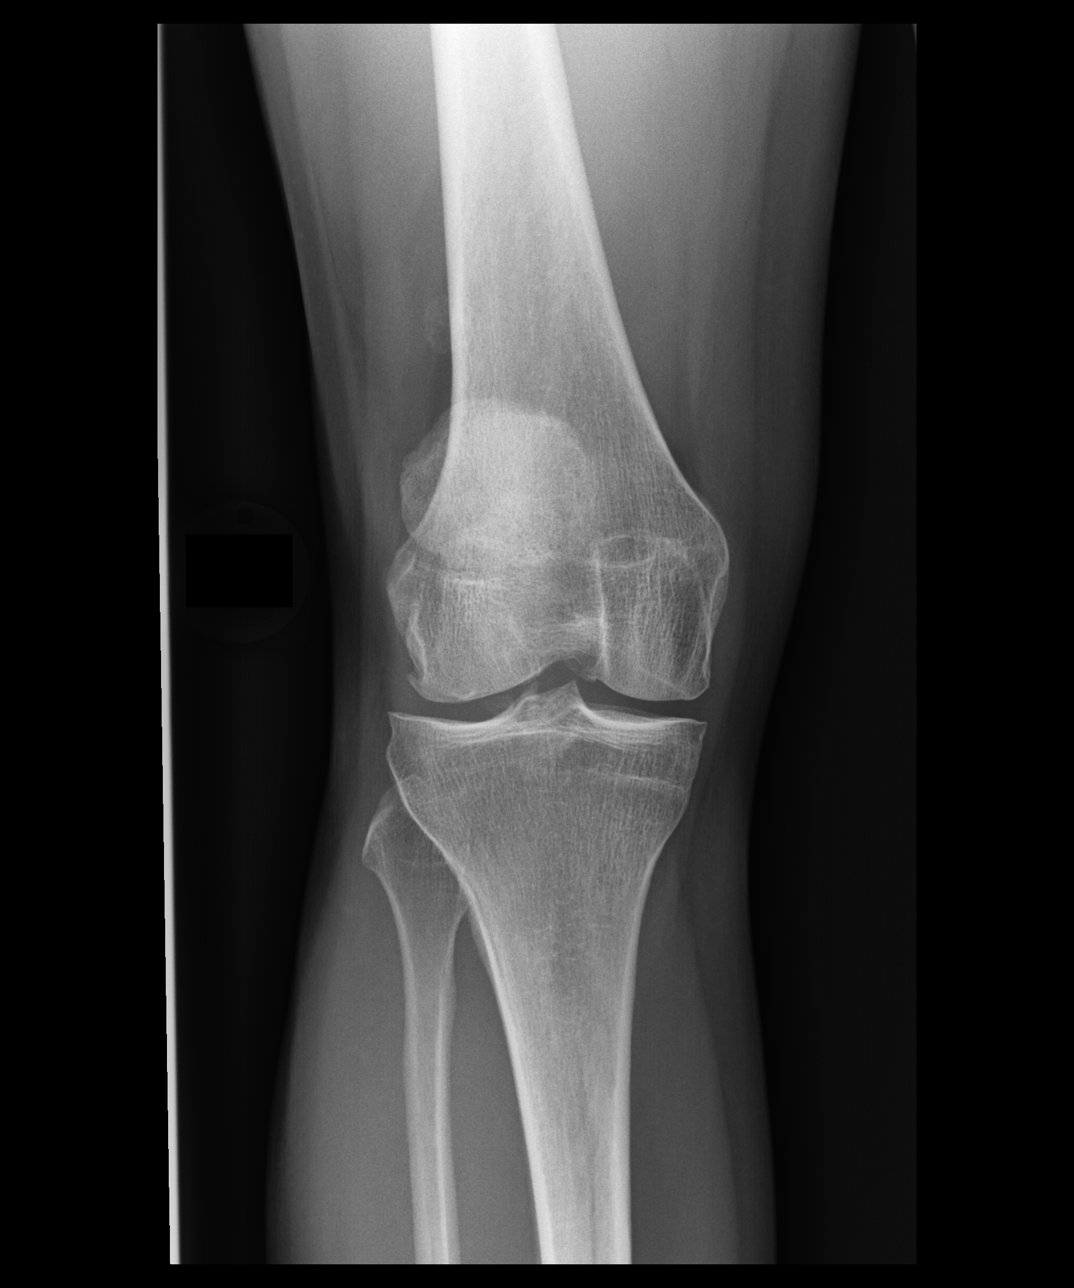

[dg knee ap/lat w/ sunrise right (2 of 3)]
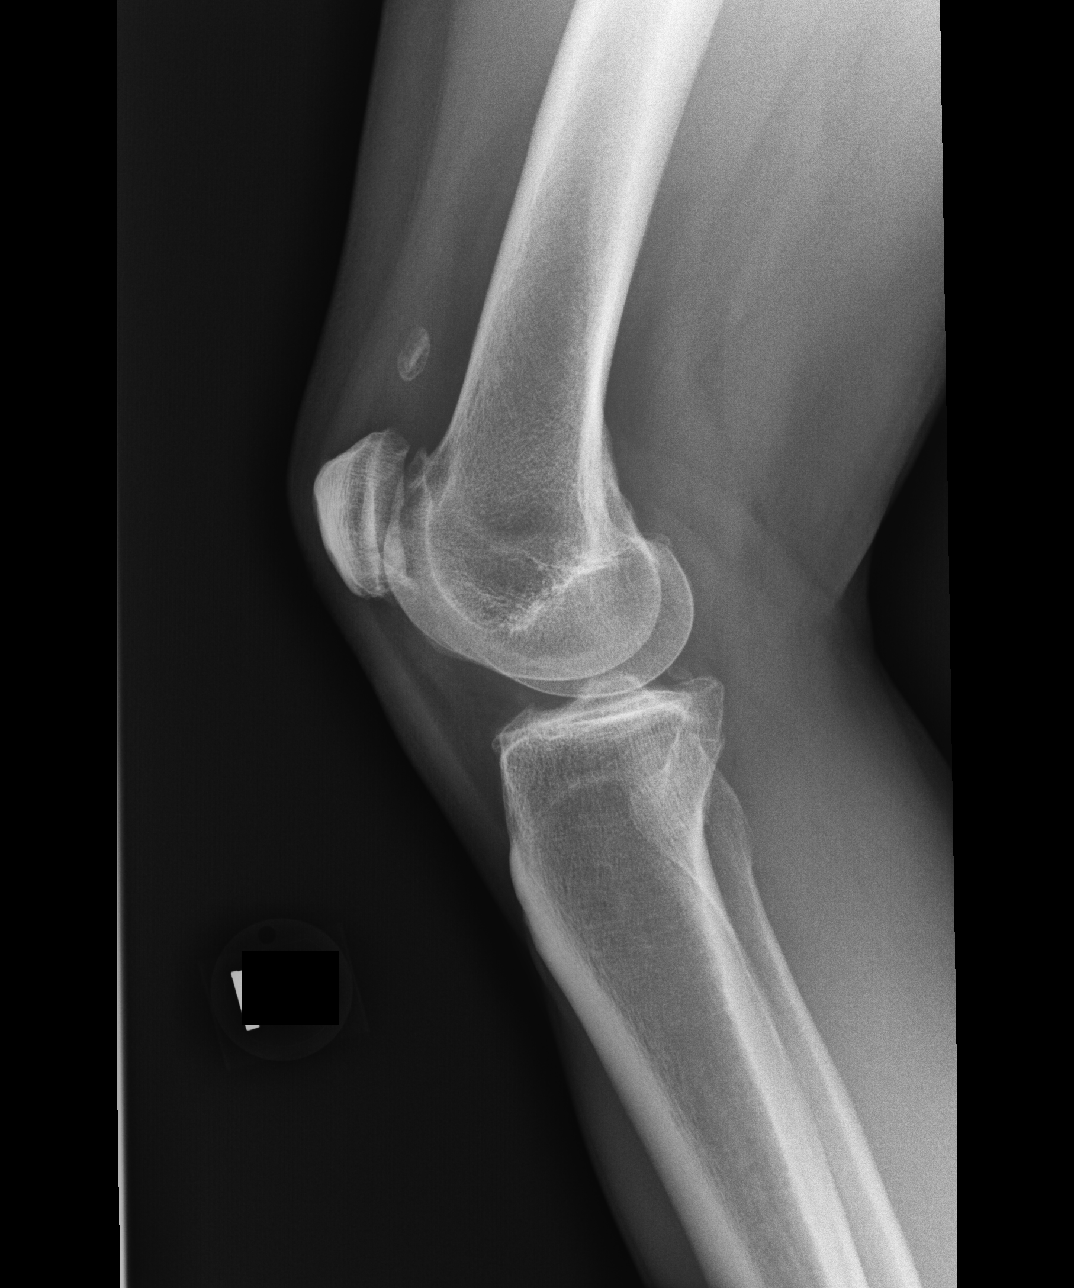

[dg knee ap/lat w/ sunrise right (3 of 3)]
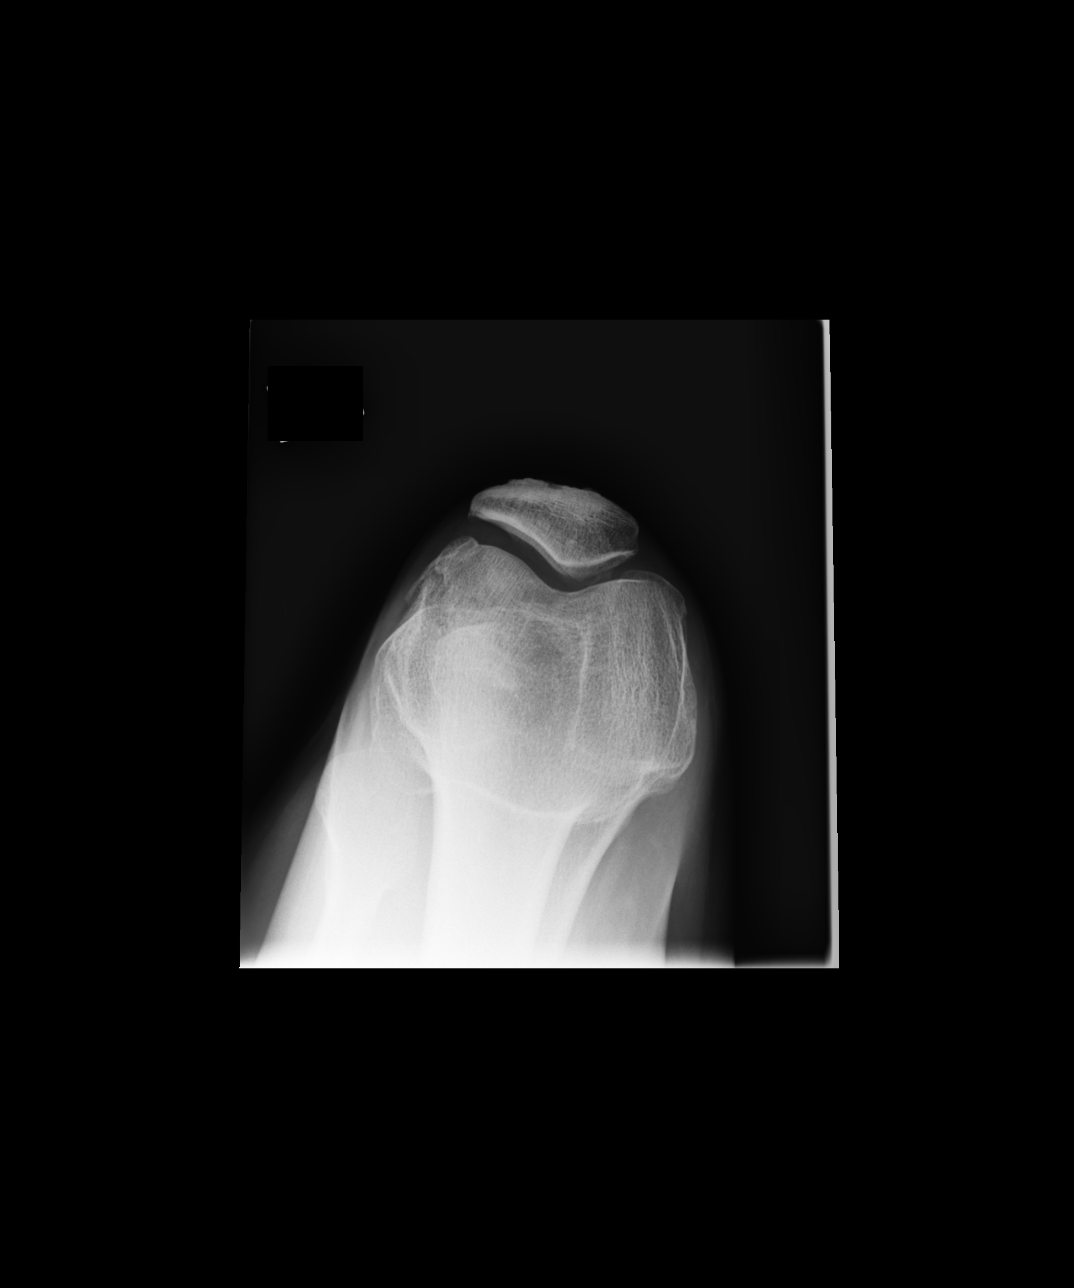

[3 of 3 positions shown; findings below may reference images not displayed]

FINDINGS: Moderate patellofemoral joint degenerative changes.

Within the lateral aspect the suprapatellar region is a new 1.1 x
0.9 x 0.7 cm ossific structure.

Slight progression of lateral tibiofemoral joint degenerative
changes.

7 x 6 x 4 mm loose body posterior aspect of the lateral joint space.

No fracture or dislocation.
IMPRESSION: Progressive degenerative changes when compared to prior examination
including:

Moderate patellofemoral joint degenerative changes.

Mild lateral tibiofemoral joint degenerative changes.

New osteophytic structures including:

1.9 x 0.9 x 0.7 cm ossific structure within the lateral aspect of
the suprapatellar region.

7 x 6 x 4 mm loose body posterior aspect of the lateral joint space.

## 2020-01-15 ENCOUNTER — Encounter (INDEPENDENT_AMBULATORY_CARE_PROVIDER_SITE_OTHER): Payer: Self-pay | Admitting: Otolaryngology

## 2020-01-15 ENCOUNTER — Ambulatory Visit (INDEPENDENT_AMBULATORY_CARE_PROVIDER_SITE_OTHER): Payer: BC Managed Care – PPO | Admitting: Otolaryngology

## 2020-01-15 ENCOUNTER — Other Ambulatory Visit: Payer: Self-pay

## 2020-01-15 VITALS — Temp 97.9°F

## 2020-01-15 DIAGNOSIS — H93292 Other abnormal auditory perceptions, left ear: Secondary | ICD-10-CM | POA: Diagnosis not present

## 2020-01-15 NOTE — Progress Notes (Signed)
HPI: [name] [name] is a 49 y.o. female who presents for evaluation of ear complaints.  I apparently see her father frequently for ear problems and he referred her here. Approximately 2 weeks ago patient noticed some distorted hearing in the left ear.  She was seen in urgent care and was prescribed Flonase and prednisone.  She used the Flonase for several days but did not use the prednisone as she was concerned about the side effects of prednisone.  The hearing in the left ear seemed to get better but now she complains of some mild symptoms in both ears.  She is very active and when she runs this seems to make some of the symptoms worse.  When she runs she still feels a little something in her head or ears.  Does not appear to have hearing difficulty presently.  No past medical history on file. Past Surgical History:  Procedure Laterality Date  . left knee arthroscopy and lateral release  Left    Completed when patient was in high school   Social History   Socioeconomic History  . Marital status: Single    Spouse name: Not on file  . Number of children: Not on file  . Years of education: Not on file  . Highest education level: Not on file  Occupational History  . Not on file  Tobacco Use  . Smoking status: Never Smoker  . Smokeless tobacco: Never Used  Substance and Sexual Activity  . Alcohol use: Not on file  . Drug use: Not on file  . Sexual activity: Not on file  Other Topics Concern  . Not on file  Social History Narrative  . Not on file   Social Determinants of Health   Financial Resource Strain:   . Difficulty of Paying Living Expenses: Not on file  Food Insecurity:   . Worried About Programme researcher, broadcasting/film/video in the Last Year: Not on file  . Ran Out of Food in the Last Year: Not on file  Transportation Needs:   . Lack of Transportation (Medical): Not on file  . Lack of Transportation (Non-Medical): Not on file  Physical Activity:   . Days of Exercise per Week: Not on file   . Minutes of Exercise per Session: Not on file  Stress:   . Feeling of Stress : Not on file  Social Connections:   . Frequency of Communication with Friends and Family: Not on file  . Frequency of Social Gatherings with Friends and Family: Not on file  . Attends Religious Services: Not on file  . Active Member of Clubs or Organizations: Not on file  . Attends Banker Meetings: Not on file  . Marital Status: Not on file   No family history on file. No Known Allergies Prior to Admission medications   Medication Sig Start Date End Date Taking? Authorizing Provider  naproxen (NAPROSYN) 500 MG tablet Take 1 tablet (500 mg total) by mouth 2 (two) times daily as needed. 11/07/17  Yes Enid Baas, MD  norethindrone-ethinyl estradiol (JUNEL FE,GILDESS FE,LOESTRIN FE) 1-20 MG-MCG tablet Take 1 tablet by mouth daily.   Yes [provider]     Positive ROS: Otherwise negative  All other systems have been reviewed and were otherwise negative with the exception of those mentioned in the HPI and as above.  Physical Exam: Constitutional: Alert, well-appearing, no acute distress Ears: External ears without lesions or tenderness. Ear canals are clear bilaterally with intact, clear TMs bilaterally with good  mobility on pneumatic otoscopy.  On hearing screening with a 512 1024 tuning fork she seems to hear well in both ears with AC > BC bilaterally.  Dix-Hallpike testing revealed no evidence of BPPV. Nasal: External nose without lesions. Clear nasal passages Oral: Lips and gums without lesions. Tongue and palate mucosa without lesions. Posterior oropharynx clear. Neck: No palpable adenopathy or masses Respiratory: Breathing comfortably  Skin: No facial/neck lesions or rash noted.  Procedures  Assessment: Distorted hearing questionable etiology.  May have been mild sudden SNHL.  On hearing screening with the tuning forks I cannot depict any significant SNHL.  Plan: If  symptoms persist discussed with her concerning obtaining an audiogram with [place] next week.  Radene Journey, MD

## 2020-08-18 ENCOUNTER — Ambulatory Visit: Payer: BC Managed Care – PPO | Admitting: Sports Medicine

## 2020-08-18 ENCOUNTER — Other Ambulatory Visit: Payer: Self-pay

## 2020-08-18 ENCOUNTER — Encounter: Payer: Self-pay | Admitting: Sports Medicine

## 2020-08-18 VITALS — BP 102/60 | Ht 63.0 in | Wt 108.0 lb

## 2020-08-18 DIAGNOSIS — M25551 Pain in right hip: Secondary | ICD-10-CM | POA: Diagnosis not present

## 2020-08-18 NOTE — Progress Notes (Signed)
   PCP: [name] [name], No Pcp Per  Subjective:   HPI: [name] [name] is a 49 y.o. female here for evaluation of right hip pain.  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  She reports that 1 to 2 months ago, she started having pain over the lateral aspect of her hip.  It is fairly mild and achy, it does not bother her very much when she is running but will be painful after runs.  Is worse after long runs.  She has been slowly trying to increase her mileage in preparation for half marathon but not severe changes.  She does note that the pain started since she tried a wider width shoe for her bunions.  She also notes that she has rubbing on the inside of her shoes bilaterally which is new.  No trauma to the area.  Review of Systems:  Per HPI.   PMFSH, medications and smoking status reviewed.      Objective:  Physical Exam:  [facility] 08/18/2020 08/18/2020  Frequency of aerobic exercise (# of days/week) 6 -  Average time in minutes 40 -  Frequency of strengthening activities (# of days/week) 0 0    BP 102/60   Ht 5\' 3"  (1.6 m)   Wt 108 lb (49 kg)   BMI 19.13 kg/m   Gen: awake, alert, NAD, comfortable in exam room Pulm: breathing unlabored  R Hip:  Inspection: No evidence of erythema, ecchymosis, swelling, edema  Palpation: [name] [name] has tenderness over the ASIS and distal to this.  No tenderness to palpation to greater trochanteric bursa, AIIS. No pain with pelvic compression. ROM: Intact ROM to IR and ER to hip, equal and symmetric.  Strength: Intact to resisted hip flexion/extension. Intact to resisted leg flexion/extension.  Gluteus medius and gluteus minimus strength 5/5 bilaterally.  She does have bilateral weakness of TFL with resisted contraction. Special tests:  leg length discrepancy with left shorter.. Neg log roll test. Neg FABER.  Negative FADIR.  Negative Ober's bilaterally.  Negative Thomas test  bilaterally.  Gait: [name] [name]'s running gait assessed and shows mid to forefoot strike. mild crossover and minimal space between medial malleoli at mid swing.  Good overall running form.   Assessment & Plan:  1.  Right hip pain [name] [name] with weakness of tensor fascia lata bilaterally and pain is near the insertion of the tensor fascia lata into the ASIS.  This in combination with crossover seen on running gait suggests that this may be the cause of her pain.  After adding a medial heel wedge to her previously made orthotics, [name] [name] did show less crossover and reported increased comfort during running.  We will plan to trial these new orthotics, and also will give her abduction/flexion and abduction exercises to strengthen TFL bilaterally.  Follow-up in 1 month for reevaluation.  , MD [facility] Fellow 08/18/2020 2:16 PM   I observed and examined the [name] [name] with the [facility] resident and agree with assessment and plan.  Note reviewed and modified by me. [facility], MD

## 2020-08-18 NOTE — Patient Instructions (Addendum)
Evaluation of right hip pain thank you for coming in to see [place] today! Please see below to review our plan for today's visit:   Please start doing the below 3 exercises 1-2 times per day, and build up to 3 sets of 15 repetitions on each side: -While standing, 8 lift your leg with your knee pointed towards the middle of your body.  You may use an ankle weight to help make this a bit tougher. -Side-lying lateral leg raises -Side-lying lateral leg raises with your toe pointed inward injured leg flexed about 5 degrees.  We will plan to have you back in a month or sooner to evaluate how your new inserts are doing and to make a custom orthotic if they are beneficial. Of Please call the clinic at [phone] if your symptoms worsen or you have any concerns. It was our pleasure to serve you.       Dr. Guy Sandifer Dr. Roanna Epley [facility]

## 2020-09-06 ENCOUNTER — Ambulatory Visit: Payer: BC Managed Care – PPO | Admitting: Sports Medicine

## 2020-09-19 ENCOUNTER — Ambulatory Visit (INDEPENDENT_AMBULATORY_CARE_PROVIDER_SITE_OTHER): Payer: BC Managed Care – PPO | Admitting: Otolaryngology

## 2020-09-19 ENCOUNTER — Other Ambulatory Visit: Payer: Self-pay

## 2020-09-19 ENCOUNTER — Encounter (INDEPENDENT_AMBULATORY_CARE_PROVIDER_SITE_OTHER): Payer: Self-pay | Admitting: Otolaryngology

## 2020-09-19 VITALS — Temp 97.7°F

## 2020-09-19 DIAGNOSIS — H938X3 Other specified disorders of ear, bilateral: Secondary | ICD-10-CM | POA: Diagnosis not present

## 2020-09-19 DIAGNOSIS — H9312 Tinnitus, left ear: Secondary | ICD-10-CM | POA: Diagnosis not present

## 2020-09-19 NOTE — Progress Notes (Signed)
HPI: [name] [name] is a 49 y.o. female who returns today for evaluation of persistent ear complaints.  She still has persistent symptoms dating back to last visit in February with pressure in her ears which is worse on the left side.  She feels like the ears need to "pop".  However she has not noted any hearing problems although she does describe a slight tinnitus in the left ear. She also states that she uses an inversion table and when she gets off the inversion table after hanging upside down after running she gets dizzy which she has not had previously.  Does not really describe true vertigo.  She does not feel like she hears quite as well out of the left ear..  No past medical history on file. Past Surgical History:  Procedure Laterality Date  . left knee arthroscopy and lateral release  Left    Completed when patient was in high school   Social History   Socioeconomic History  . Marital status: Single    Spouse name: Not on file  . Number of children: Not on file  . Years of education: Not on file  . Highest education level: Not on file  Occupational History  . Not on file  Tobacco Use  . Smoking status: Never Smoker  . Smokeless tobacco: Never Used  Substance and Sexual Activity  . Alcohol use: Not on file  . Drug use: Not on file  . Sexual activity: Not on file  Other Topics Concern  . Not on file  Social History Narrative  . Not on file   Social Determinants of Health   Financial Resource Strain:   . Difficulty of Paying Living Expenses: Not on file  Food Insecurity:   . Worried About Programme researcher, broadcasting/film/video in the Last Year: Not on file  . Ran Out of Food in the Last Year: Not on file  Transportation Needs:   . Lack of Transportation (Medical): Not on file  . Lack of Transportation (Non-Medical): Not on file  Physical Activity:   . Days of Exercise per Week: Not on file  . Minutes of Exercise per Session: Not on file  Stress:   . Feeling of Stress : Not on file   Social Connections:   . Frequency of Communication with Friends and Family: Not on file  . Frequency of Social Gatherings with Friends and Family: Not on file  . Attends Religious Services: Not on file  . Active Member of Clubs or Organizations: Not on file  . Attends Banker Meetings: Not on file  . Marital Status: Not on file   No family history on file. No Known Allergies Prior to Admission medications   Medication Sig Start Date End Date Taking? Authorizing Provider  naproxen (NAPROSYN) 500 MG tablet Take 1 tablet (500 mg total) by mouth 2 (two) times daily as needed. 11/07/17  Yes Enid Baas, MD  norethindrone-ethinyl estradiol (JUNEL FE,GILDESS FE,LOESTRIN FE) 1-20 MG-MCG tablet Take 1 tablet by mouth daily.   Yes [provider]     Positive ROS: Otherwise negative  All other systems have been reviewed and were otherwise negative with the exception of those mentioned in the HPI and as above.  Physical Exam: Constitutional: Alert, well-appearing, no acute distress Ears: External ears without lesions or tenderness. Ear canals are clear bilaterally.  TMs are clear bilaterally with good mobility on pneumatic otoscopy with no negative pressure noted and no middle ear abnormality noted.  No middle  ear effusion.  Hearing screening with the 512 1024 tuning fork revealed AC > BC bilaterally and essentially normal hearing in both ears. Nasal: External nose without lesions. Septum relatively midline.. Clear nasal passages bilaterally. Oral: Lips and gums without lesions. Tongue and palate mucosa without lesions. Posterior oropharynx clear.  Tonsil regions benign bilaterally. Neck: No palpable adenopathy or masses.  No TMJ abnormality noted. Respiratory: Breathing comfortably  Skin: No facial/neck lesions or rash noted.  Procedures  Assessment: Ear pressure with left ear tinnitus.  Normal examination.  Plan: Gave her samples of Lipo flavonoid to try to see if  this helps at all with the tinnitus. We will plan on scheduling audiogram and have her follow-up following audiologic testing.  If she has any asymmetric SNHL or worsening tinnitus may warrant further work-up with MRI if indicated.   Narda Bonds, MD

## 2020-09-20 ENCOUNTER — Other Ambulatory Visit: Payer: Self-pay

## 2020-09-20 ENCOUNTER — Ambulatory Visit: Payer: BC Managed Care – PPO | Admitting: Sports Medicine

## 2020-09-20 VITALS — BP 102/72 | Ht 63.0 in | Wt 108.0 lb

## 2020-09-20 DIAGNOSIS — M7632 Iliotibial band syndrome, left leg: Secondary | ICD-10-CM

## 2020-09-20 MED ORDER — NITROGLYCERIN 0.2 MG/HR TD PT24: MEDICATED_PATCH | TRANSDERMAL | 1 refills | Status: AC

## 2020-09-20 NOTE — Assessment & Plan Note (Signed)
Most likely her pain is coming from a small avulsion partial tear of the TFL origin on the right. -Start nitroglycerin with protocol given -Continue lateral leg lifts and hip abductor exercises with ankle weights increasing weight as pain tolerates -Follow-up with [place] in 6 weeks -we did discuss that she is unlikely to cause significant injury by running in the half marathon coming up in [place] on November 13 this year.  However we did let her know that it is unlikely to be fully healed at this time she may have soreness and discomfort.  We recommended that she follow-up with [place] after she run this race.

## 2020-09-20 NOTE — Progress Notes (Signed)
SUBJECTIVE:   CHIEF COMPLAINT / HPI:   Right hip pain [name] is a very pleasant 49 year old female who presents today for follow-up for right hip pain which was initially diagnosed as pain at the origin of the ITB.  She was given hip abductor exercises along with lateral leg lifts and given adjustments to her orthotics.  She returns today stating she has had no significant change or improvement in her right hip pain.  He continues to be a problem when she goes on runs and does not bother her otherwise.  The pain is still located just below and around the ASIS on the right it is nonradiating and hurts when she flexes her hip or when she does runs or goes up hills.  She is currently training for marathon that is scheduled to be running [place] next month on November 13.  She is not sure if the new shoes that she got for running with a wide toe box and wide heel flare has contributed but it does get better with rest but returns as soon as she is more active.  She denies any numbness or tingling or giving way.  PERTINENT  PMH / PSH: Hallux valgus of the left foot, CPPD, OA of the right knee  ROS: Fullness in ear/ 2 ENT evals showed normal ear exam/  Ears don't seem to "pop"  OBJECTIVE:   There were no vitals taken for this visit.  [facility] 08/18/2020 08/18/2020 09/20/2020  Frequency of aerobic exercise (# of days/week) 6 - 6  Average time in minutes 40 - 40  Frequency of strengthening activities (# of days/week) 0 0 -   Hip, Right: No obvious rash, erythema, ecchymosis, or edema. ROM full in all directions but with pain on hip flexion past 90 degrees; Strength 5/5 in IR/ER/Flex/Ext/Abd/Add. ITB strength testing was normal. Pelvic alignment unremarkable to inspection and palpation. Standing hip rotation and gait without unsteadiness. Greater trochanter without tenderness to palpation. No tenderness over piriformis. No SI joint tenderness and normal  minimal SI movement. Special Test:   - Passive Log Roll test: NEG   - Thomas test: NEG   - Ober's test: Positive for pain at insertion of TFL Side bending ITB stretch also causes pain    Limited MSK U/S of Right Hip At the origin of the tensor fascia lata of the right hip was in a small area of hypoechoic fluid accumulation just above small raised hyperechoic fragment coming off the ASIS.  No fiber disruption or tissue hypoechoic changes of the TFL tendon or at the ATFL origin.  Bone with slight spurring and small irregularity also seen at the ASIS. Interpretation: Small avulsion fracture at the origin of the TFL tendon with some spurring and mild swelling surrounding the area.  Consistent with small partial tear of the proximal insertion of the TFL tendon.  Ultrasound and interpretation by Dr. Karen Chafe and Sibyl Parr. Fields, MD   ASSESSMENT/PLAN:   Iliotibial band syndrome Most likely her pain is coming from a small avulsion partial tear of the TFL origin on the right. -Start nitroglycerin with protocol given -Continue lateral leg lifts and hip abductor exercises with ankle weights increasing weight as pain tolerates -Follow-up with [place] in 6 weeks -we did discuss that she is unlikely to cause significant injury by running in the half marathon coming up in [place] on November 13 this year.  However we did let her know that it is unlikely to  be fully healed at this time she may have soreness and discomfort.  We recommended that she follow-up with [place] after she run this race.      Arlyce Harman, DO PGY-4, Sports Medicine Fellow [facility]  I observed and examined the patient with the [facility] resident and agree with assessment and plan.  Note reviewed and modified by me. Sterling Big, MD

## 2020-09-20 NOTE — Patient Instructions (Signed)
It was great to see you today! Thank you for letting me participate in your care!  Today, we discussed your continued hip pain which is most likely due to a small tear at the origin of the tensor fascia lata tendon. Continue with the hip abductor exercises and lateral leg lifts. Increase your ankle weights as pain allows. Start using the nitroglycerin patches as indicated below and follow up with [place] about 2 weeks after your race.  Nitroglycerin Protocol   Apply 1/4 nitroglycerin patch to affected area daily.  Change position of patch within the affected area every 24 hours.  You may experience a headache during the first 1-2 weeks of using the patch, these should subside.  If you experience headaches after beginning nitroglycerin patch treatment, you may take your preferred over the counter pain reliever.  Another side effect of the nitroglycerin patch is skin irritation or rash related to patch adhesive.  Please notify our office if you develop more severe headaches or rash, and stop the patch.  Tendon healing with nitroglycerin patch may require 12 to 24 weeks depending on the extent of injury.  Men should not use if taking Viagra, Cialis, or Levitra.   Do not use if you have migraines or rosacea.    Be well, Jules Schick, DO PGY-4, Sports Medicine Fellow [facility]

## 2020-09-30 ENCOUNTER — Ambulatory Visit (INDEPENDENT_AMBULATORY_CARE_PROVIDER_SITE_OTHER): Payer: BC Managed Care – PPO | Admitting: Otolaryngology

## 2020-10-25 ENCOUNTER — Ambulatory Visit: Payer: BC Managed Care – PPO | Admitting: Sports Medicine

## 2020-10-25 ENCOUNTER — Other Ambulatory Visit: Payer: Self-pay

## 2020-10-25 VITALS — BP 108/76 | Ht 63.0 in | Wt 108.0 lb

## 2020-10-25 DIAGNOSIS — M7632 Iliotibial band syndrome, left leg: Secondary | ICD-10-CM

## 2020-10-25 NOTE — Progress Notes (Signed)
SUBJECTIVE:   CHIEF COMPLAINT / HPI:   Left ITB avulsion [name] [name] is here for follow-up after having been diagnosed with an avulsion at the origin of the IT band on the left side.  She did not run the race she was scheduled to run in November due to the fact that she did not feel like she had made no progress in and healing to do so.  Since she has been last seen in the office by [place] she is continue using the nitro patches daily and feels like they are helping.  Overall, she feels like she has improved very much she has been doing the leg lifts but had difficulty performing them when adding the weight.  So she has continued to do them with just her leg weight against gravity only.  She is also been doing other exercises and is interested in incorporating other things to help prevent further injury in the future and build up strength in the supporting muscles such as hip stabilizers and core.  She states she is still tender in that area of some however she feels like she has made much improvement.  PERTINENT  PMH / PSH: Hallux valgus of the left foot osteoarthritis of the right knee history of patellar subluxation history of quadriceps tendinitis.  OBJECTIVE:   BP 108/76   Ht 5\' 3"  (1.6 m)   Wt 108 lb (49 kg)   BMI 19.13 kg/m   [facility] Adult Exercise 08/18/2020 08/18/2020 09/20/2020 10/25/2020  Frequency of aerobic exercise (# of days/week) 6 - 6 6  Average time in minutes 40 - 40 40  Frequency of strengthening activities (# of days/week) 0 0 - 2   Hip, Right: No obvious rash, erythema, ecchymosis, or edema. Mild TTP at origin of ITB. ROM full in all directions but now without pain on hip flexion past 90 degrees; Strength 5/5 in IR/ER/Flex/Ext/Abd/Add. ITB strength testing was improved when compared to previous exam along with testing of pelvic stabilizer muscle group. Pelvic alignment unremarkable to inspection and palpation. Standing hip rotation and gait without unsteadiness.  Greater trochanter without tenderness to palpation. No tenderness over piriformis. No SI joint tenderness and normal minimal SI movement. Special Test:                         - Passive Log Roll test: NEG                         - Thomas test: NEG                         - Ober's test: Positive for mild pain at insertion of TFL Side bending ITB stretch also causes pain  Running gait is very neutral today and no limp  ASSESSMENT/PLAN:   Iliotibial band syndrome Improving partial tear of the ATFL origin. -Continue nitroglycerin protocol for another 6 weeks -Continue lateral leg lifts and hip abductor exercises also now incorporating band exercises.  Continue work-up to using ankle weights with these exercises. -Follow-up in 6 weeks however she is completely improved and comfortable she can discontinue the nitro on her own and not follow-up and just follow-up as needed.     10/27/2020, DO PGY-4, Sports Medicine Fellow [facility]  I observed and examined the patient with the [facility] resident and agree with assessment and plan.  Note reviewed and modified  by me. [name] Big, MD

## 2020-10-25 NOTE — Assessment & Plan Note (Addendum)
Improving partial tear of the ATFL origin. -Continue nitroglycerin protocol for another 6 weeks -Continue lateral leg lifts and hip abductor exercises also now incorporating band exercises.  Continue work-up to using ankle weights with these exercises. -Follow-up in 6 weeks however she is completely improved and comfortable she can discontinue the nitro on her own and not follow-up and just follow-up as needed.

## 2020-10-25 NOTE — Patient Instructions (Signed)
It was great to see you today!  We discussed the following: -Continue using the nitro patches -Continue doing your hip abductor exercises and add in band strengthening and wall squats -Follow up with [place] in 4 weeks if you aren't improving

## 2021-03-29 ENCOUNTER — Other Ambulatory Visit: Payer: Self-pay | Admitting: Otolaryngology

## 2021-03-29 DIAGNOSIS — H918X9 Other specified hearing loss, unspecified ear: Secondary | ICD-10-CM

## 2021-04-06 ENCOUNTER — Ambulatory Visit: Payer: BC Managed Care – PPO | Admitting: Sports Medicine

## 2021-04-06 ENCOUNTER — Other Ambulatory Visit: Payer: Self-pay

## 2021-04-06 DIAGNOSIS — G5761 Lesion of plantar nerve, right lower limb: Secondary | ICD-10-CM

## 2021-04-06 NOTE — Patient Instructions (Signed)
  It was good seeing you today!  You have a Morton's neuroma.  We are going to give you 3 different types of pads to experiment with in your running shoes.  It is important that you continue to wear good cushioned shoes until your symptoms improve.  You may also try taking over-the-counter Aleve twice a day for 5 days, but if you do this take it with food to prevent stomach upset.  If you need help with placing these new pads in your shoes just come by the office and we can help you with that.  If you continue to have symptoms despite today's treatment, return to the office for an ultrasound.

## 2021-04-06 NOTE — Progress Notes (Signed)
   Subjective:    Patient ID: [name] [name], female    DOB: [birth year], 50 y.o.   MRN: [id number]  HPI chief complaint: Right third toe pain  Very pleasant 50 year old runner comes in today complaining of approximately 2 weeks of pain in her right third toe.  She recently completed a series of races including a half marathon, a 10K race, and some shorter distance events as part of a [employer] track and field competition.  She recently began to experience some discomfort that she localizes to the plantar aspect of her foot around the third metatarsal with "zinging" type pain into the lateral aspect of the third toe.  She has been able to continue running.  She notices that a well-padded shoe helps.  Her symptoms are most noticeable when running on trails, especially if she lands on something hard such as a rock or a root.  She denies pain of the dorsum of the foot.  She has not noticed any swelling of the dorsum of her foot.  She denies any similar injuries in the past.  She does have custom orthotics which Dr. Darrick Penna made for her previously but she is not wearing them in her current running shoes ([employer]).  Interim medical history reviewed Medications reviewed Allergies reviewed   Review of Systems    As above Objective:   Physical Exam  Well-developed, well-nourished.  No acute distress  Right foot: There is some tenderness to palpation between the third and fourth metatarsal heads on the plantar aspect of the foot.  This does reproduce neuropathic symptoms into the third toe.  There is no tenderness to palpation across the dorsum of the foot.  No soft tissue swelling.  Bilateral transverse arch collapse when standing.  Bilateral bunions, left greater than right.  Good pulses.       Assessment & Plan:   Right foot and toe pain secondary to Morton's neuroma  Patient is provided with 3 separate Hapads to try and her running shoes.  She will start with a simple metatarsal pad first.  If  this is not helpful, she will then try a neuroma pad, and then lastly trying a metatarsal cookie.  She understands the importance of continuing to wear well cushioned running shoes.  I recommended some over-the-counter Aleve twice daily for 3 to 4 days.  If symptoms persist for another couple of weeks, then return to the office for reevaluation and ultrasound evaluation.  She will also need to bring in her old orthotics so that we may look at those and potentially construct her new orthotics (perhaps the soft cell orthotics since those will cause less crowding at the toebox).

## 2021-04-12 ENCOUNTER — Other Ambulatory Visit: Payer: BC Managed Care – PPO

## 2021-05-23 ENCOUNTER — Encounter: Payer: Self-pay | Admitting: Physical Therapy

## 2021-05-23 ENCOUNTER — Other Ambulatory Visit: Payer: Self-pay

## 2021-05-23 ENCOUNTER — Ambulatory Visit: Payer: BC Managed Care – PPO | Attending: Otolaryngology | Admitting: Physical Therapy

## 2021-05-23 DIAGNOSIS — R2681 Unsteadiness on feet: Secondary | ICD-10-CM | POA: Diagnosis present

## 2021-05-23 DIAGNOSIS — R42 Dizziness and giddiness: Secondary | ICD-10-CM

## 2021-05-23 NOTE — Therapy (Signed)
[facility] [address] [place], [place], [address] Phone: [phone]   Fax:  [phone]  Physical Therapy Evaluation  Patient Details  Name: [name] [name] MRN: [id number] Date of Birth: 02-04-1971 Referring Provider (PT): Newman Pies, MD   Encounter Date: 05/23/2021   PT End of Session - 05/23/21 1645     Visit Number 1    Number of Visits 9    Date for PT Re-Evaluation 07/22/21    Authorization Type State BCBS    PT Start Time 1536    PT Stop Time 1620    PT Time Calculation (min) 44 min    Activity Tolerance Patient tolerated treatment well    Behavior During Therapy [facility] for tasks assessed/performed             History reviewed. No pertinent past medical history.  Past Surgical History:  Procedure Laterality Date   left knee arthroscopy and lateral release  Left    Completed when patient was in high school    There were no vitals filed for this visit.    Subjective Assessment - 05/23/21 1542     Subjective Pt reports a year ago in February pt experienced aural fullness/pressure in L ear.  Did not resolve and when pt would run she would experience dizziness especially on longer, harder runs at the end of the run.  At times it would persist for a few hours afterwards.  Does affect her balance.    Pertinent History L knee arthroscopy, OA R knee, patellar subluxation, quadriceps tendonitis, Hallux valgus L foot, IT band syndrome, L lateral epicondylitis of elbow    Diagnostic tests Has not had MRI    Patient Stated Goals To recalibrate her vestibular system for more activity    Currently in Pain? No/denies                [facility] PT Assessment - 05/23/21 1549       Assessment   Medical Diagnosis Dizziness    Referring Provider (PT) Newman Pies, MD    Onset Date/Surgical Date 05/03/21    Prior Therapy not for dizziness      Precautions   Precautions Other (comment)    Precaution Comments L knee  arthroscopy, OA R knee, patellar subluxation, quadriceps tendonitis, Hallux valgus L foot, IT band syndrome, L lateral epicondylitis of elbow      Restrictions   Weight Bearing Restrictions No      Balance Screen   Has the patient fallen in the past 6 months Yes    How many times? 1 sycope in the bathroom - hit front of her face when she had a stomach virus      Home Environment   Living Environment Private residence    Additional Comments Driving      Prior Function   Level of Independence Independent    Vocation Full time employment    Vocation Requirements desk job    Leisure gardening, run, biking      Observation/Other Assessments   Focus on Therapeutic Outcomes (FOTO)  Dizziness Functional Status: 55; Dizziness Emotional Status: 56.5; Dizziness Positional Status: 58.4      Sensation   Light Touch Appears Intact      Coordination   Gross Motor Movements are Fluid and Coordinated Yes    Finger Nose Finger Test [facility]    Heel Shin Test WFL      ROM / Strength   AROM / PROM / Strength Strength  Strength   Overall Strength Within functional limits for tasks performed                    Vestibular Assessment - 05/23/21 1554       Symptom Behavior   Subjective history of current problem 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  Intermittent mild HA.  Denies neck pain.    Type of Dizziness  Imbalance    Frequency of Dizziness intermittent    Duration of Dizziness minutes > hours    Symptom Nature Motion provoked;Positional    Aggravating Factors Activity in general;Forward bending    Relieving Factors No known relieving factors    Progression of Symptoms No change since onset      Oculomotor Exam   Oculomotor Alignment Normal    Ocular ROM WFL    Spontaneous Absent    Gaze-induced  Absent    Smooth Pursuits Intact    Saccades Intact      Oculomotor Exam-Fixation Suppressed    Left Head Impulse Postive    Right Head Impulse  negative      Vestibulo-Ocular Reflex   VOR to Slow Head Movement Normal    VOR Cancellation Normal      Visual Acuity   Static 8    Dynamic 5      Positional Testing   Sidelying Test Sidelying Right;Sidelying Left    Horizontal Canal Testing Horizontal Canal Right;Horizontal Canal Left      Positional Sensitivities   Sit to Supine No dizziness    Supine to Left Side No dizziness    Supine to Right Side No dizziness    Supine to Sitting No dizziness    Right Hallpike No dizziness    Up from Right Hallpike No dizziness    Up from Left Hallpike No dizziness    Nose to Right Knee No dizziness    Right Knee to Sitting No dizziness    Nose to Left Knee No dizziness    Left Knee to Sitting No dizziness    Head Turning x 5 No dizziness    Head Nodding x 5 No dizziness    Pivot Right in Standing No dizziness    Pivot Left in Standing No dizziness    Rolling Right No dizziness    Rolling Left No dizziness                Objective measurements completed on examination: See above findings.               PT Education - 05/23/21 1644     Education Details Clinical findings, PT POC and goals; recommended cutting runs in half for now and to modify stretches for decreased head down position for now    Person(s) Educated Patient    Methods Explanation    Comprehension Verbalized understanding              PT Short Term Goals - 05/23/21 1651       PT SHORT TERM GOAL #1   Title Pt will participate in sensory integration assessment with SOT    Time 3    Period Weeks    Status New    Target Date 06/13/21      PT SHORT TERM GOAL #2   Title Pt will initiate high level vestibular HEP    Time 3    Period Weeks    Status New    Target Date 06/13/21  PT Long Term Goals - 05/23/21 1652       PT LONG TERM GOAL #1   Title Pt will demonstrate independence with final HEP    Time 8    Period Weeks    Status New    Target Date 07/22/21       PT LONG TERM GOAL #2   Title Pt will demonstrate WNL composite score and vestibular score on SOT    Baseline TBD    Time 8    Period Weeks    Status New    Target Date 07/22/21      PT LONG TERM GOAL #3   Title Pt will increase DFS to >/= 60 and DPS to >/= 63    Baseline DFS: 55; DPS: 58.4.  DES was WNL    Time 8    Period Weeks    Status New    Target Date 07/22/21      PT LONG TERM GOAL #4   Title Pt will report ability to run >8 miles and perform stretching afterwards without symptoms of dizziness    Time 8    Period Weeks    Status New    Target Date 07/22/21      PT LONG TERM GOAL #5   Title Pt will report ability to bend forwards to weed/garden without any symptoms of dizziness    Time 8    Period Months    Status New    Target Date 07/22/21      Additional Long Term Goals   Additional Long Term Goals Yes      PT LONG TERM GOAL #6   Title Pt will demonstrate WNL VOR as indicated by 2 line difference on DVA    Baseline 3 line (8, 5)    Time 8    Period Weeks    Status New    Target Date 07/22/21                    Plan - 05/23/21 1646     Clinical Impression Statement Pt is a 50 year old female referred to Neuro OPPT for evaluation of dizziness that has persisted for >1 year.  Pt's PMH is significant for the following: L knee arthroscopy, OA R knee, patellar subluxation, quadriceps tendonitis, Hallux valgus L foot, IT band syndrome, L lateral epicondylitis of elbow. The following deficits were noted during pt's exam: disequilibrium, impaired VOR gain with + HIT to the L and 3 line difference on DVA, and motion sensitivity to repeated bending down to the ground.  Pt is very active and is training for a  marathon but mainly experiences symptoms after running.  Pt would benefit from skilled PT to address these impairments and functional limitations to maximize functional mobility independence and reduce falls risk.    Personal Factors and Comorbidities  Comorbidity 3+    Comorbidities L knee arthroscopy, OA R knee, patellar subluxation, quadriceps tendonitis, Hallux valgus L foot, IT band syndrome, L lateral epicondylitis of elbow    Examination-Activity Limitations Bend;Other   Run   Examination-Participation Restrictions Community Activity    Stability/Clinical Decision Making Stable/Uncomplicated    Clinical Decision Making Low    Rehab Potential Good    PT Frequency 1x / week    PT Duration 8 weeks    PT Treatment/Interventions ADLs/Self Care Home Management;Functional mobility training;Therapeutic activities;Therapeutic exercise;Balance training;Neuromuscular re-education;Patient/family education;Vestibular    PT Next Visit Plan Perform SOT; initiate high level HEP - will  need high level VOR training and habituation to bending down to ground.  Pt is working towards running 1/2 marathon    Consulted and Agree with Plan of Care Patient             Patient will benefit from skilled therapeutic intervention in order to improve the following deficits and impairments:  Dizziness, Decreased balance  Visit Diagnosis: Dizziness and giddiness  Unsteadiness on feet     Problem List Patient Active Problem List   Diagnosis Date Noted   Lateral epicondylitis of left elbow 01/01/2019   Hx of calcium pyrophosphate deposition disease (CPPD) 11/07/2017   Left knee pain 03/12/2017   Iliotibial band syndrome 05/15/2016   Hallux valgus of left foot 07/13/2015   Quadriceps tendinitis 12/15/2013   Abnormal gait 05/27/2012   Osteoarthritis of right knee 03/05/2012   Patellar subluxation 03/05/2012    Dierdre Highman, PT, DPT 05/23/21    4:57 PM   [facility] [address] [place], [place], [address] Phone: [phone]   Fax:  [phone]  Name: [name] [name] MRN: [id number] Date of Birth: [birth year]

## 2021-06-02 ENCOUNTER — Other Ambulatory Visit: Payer: Self-pay

## 2021-06-02 ENCOUNTER — Ambulatory Visit: Payer: BC Managed Care – PPO | Attending: Otolaryngology | Admitting: Physical Therapy

## 2021-06-02 DIAGNOSIS — R2681 Unsteadiness on feet: Secondary | ICD-10-CM

## 2021-06-02 DIAGNOSIS — R42 Dizziness and giddiness: Secondary | ICD-10-CM

## 2021-06-02 NOTE — Therapy (Signed)
[facility] [address] [place], [place], [address] Phone: [phone]   Fax:  [phone]  Physical Therapy Treatment  Patient Details  Name: [name] [name] MRN: [id number] Date of Birth: [birth year] Referring Provider (PT): Newman Pies, MD   Encounter Date: 06/02/2021   PT End of Session - 06/02/21 0721     Visit Number 2    Number of Visits 9    Date for PT Re-Evaluation 07/22/21    Authorization Type State BCBS    PT Start Time 0720    PT Stop Time 0805    PT Time Calculation (min) 45 min    Activity Tolerance Patient tolerated treatment well    Behavior During Therapy [facility] for tasks assessed/performed             No past medical history on file.  Past Surgical History:  Procedure Laterality Date   left knee arthroscopy and lateral release  Left    Completed when patient was in high school    There were no vitals filed for this visit.   Subjective Assessment - 06/02/21 0722     Subjective Pt had a busy week, did a longer hike at [facility] without any issues.  No spells of dizziness this week.  Possibly going for a run this morning.    Pertinent History L knee arthroscopy, OA R knee, patellar subluxation, quadriceps tendonitis, Hallux valgus L foot, IT band syndrome, L lateral epicondylitis of elbow    Diagnostic tests Has not had MRI    Patient Stated Goals To recalibrate her vestibular system for more activity    Currently in Pain? No/denies                     Vestibular Assessment - 06/02/21 0723       Balancemaster   Hospital doctor Comment Composite Score: 80             Conditions: 1: 3 trials WNL 2: 3 trials WNL 3:  First trial below normal, 2nd/3rd WNL 4: 3 trials WNL 5: First trial below normal, 2nd/3rd WNL 6: 3 trials WNL Composite score: 80 WNL Sensory Analysis Som: WNL Vis: WNL Vest: WNL Pref: WNL Strategy  analysis: 75-100 hip/ankle strategy throughout test COG alignment: Pt with COG consistently in midline but posterior     Vestibular Treatment/Exercise - 06/02/21 1212       Vestibular Treatment/Exercise   Vestibular Treatment Provided Gaze    Gaze Exercises X2 Viewing Horizontal;X2 Viewing Vertical      X2 Viewing Horizontal   Foot Position standing feet apart, solid surface    Reps 2     Comments 60 seconds, cues to keep head moving      X2 Viewing Vertical   Foot Position standing feet apart, solid surface    Reps 2    Comments 60 seconds, cues to keep head moving                Balance Exercises - 06/02/21 1213       Balance Exercises: Standing   Standing Eyes Closed Narrow base of support (BOS);Head turns;Foam/compliant surface;Other reps (comment);Limitations    Standing Eyes Closed Limitations staggered stance on pillow, 10 reps head turns with R foot forwards and then L               PT Education - 06/02/21 1212     Education Details results of SOT,  initiated higher level vestibular/balance training    Person(s) Educated Patient    Methods Explanation;Demonstration;Handout    Comprehension Verbalized understanding;Returned demonstration              PT Short Term Goals - 06/02/21 1206       PT SHORT TERM GOAL #1   Title Pt will participate in sensory integration assessment with SOT    Time 3    Period Weeks    Status Achieved    Target Date 06/13/21      PT SHORT TERM GOAL #2   Title Pt will initiate high level vestibular HEP    Time 3    Period Weeks    Status Achieved    Target Date 06/13/21               PT Long Term Goals - 06/02/21 1206       PT LONG TERM GOAL #1   Title Pt will demonstrate independence with final HEP    Time 8    Period Weeks    Status New    Target Date 07/22/21      PT LONG TERM GOAL #2   Title Pt will demonstrate WNL composite score and vestibular score on SOT    Baseline Composite score 80 -  WNL    Time 8    Period Weeks    Status Achieved      PT LONG TERM GOAL #3   Title Pt will increase DFS to >/= 60 and DPS to >/= 63    Baseline DFS: 55; DPS: 58.4.  DES was WNL    Time 8    Period Weeks    Status New      PT LONG TERM GOAL #4   Title Pt will report ability to run >8 miles and perform stretching afterwards without symptoms of dizziness    Time 8    Period Weeks    Status New      PT LONG TERM GOAL #5   Title Pt will report ability to bend forwards to weed/garden without any symptoms of dizziness    Time 8    Period Months    Status New      PT LONG TERM GOAL #6   Title Pt will demonstrate WNL VOR as indicated by 2 line difference on DVA    Baseline 3 line (8, 5)    Time 8    Period Weeks    Status New                   Plan - 06/02/21 1207     Clinical Impression Statement Performed more in depth assessment of patient's sensory integration with SOT assessment.  Pt's composite score was WNL but pt demonstrated initial difficulty with conditions 3 and 5 when vision was compromised but pt adapted quickly without any falls.  Initiated higher level vestibular training with VOR x2 viewing and balance on compliant surface with narrow BOS and vision removed.  Will continue to progress towards LTG.    Personal Factors and Comorbidities Comorbidity 3+    Comorbidities L knee arthroscopy, OA R knee, patellar subluxation, quadriceps tendonitis, Hallux valgus L foot, IT band syndrome, L lateral epicondylitis of elbow    Examination-Activity Limitations Bend;Other   Run   Examination-Participation Restrictions Community Activity    Stability/Clinical Decision Making Stable/Uncomplicated    Rehab Potential Good    PT Frequency 1x / week    PT Duration 8  weeks    PT Treatment/Interventions ADLs/Self Care Home Management;Functional mobility training;Therapeutic activities;Therapeutic exercise;Balance training;Neuromuscular re-education;Patient/family  education;Vestibular    PT Next Visit Plan Progress HEP - will need high level VOR training and habituation to bending down to ground.  Pt is working towards running 1/2 marathon    Consulted and Agree with Plan of Care Patient             Patient will benefit from skilled therapeutic intervention in order to improve the following deficits and impairments:  Dizziness, Decreased balance  Visit Diagnosis: Dizziness and giddiness  Unsteadiness on feet     Problem List Patient Active Problem List   Diagnosis Date Noted   Lateral epicondylitis of left elbow 01/01/2019   Hx of calcium pyrophosphate deposition disease (CPPD) 11/07/2017   Left knee pain 03/12/2017   Iliotibial band syndrome 05/15/2016   Hallux valgus of left foot 07/13/2015   Quadriceps tendinitis 12/15/2013   Abnormal gait 05/27/2012   Osteoarthritis of right knee 03/05/2012   Patellar subluxation 03/05/2012    Dierdre Highman, PT, DPT 06/02/21    12:16 PM    [facility] [address] [place], [place], [address] Phone: [phone]   Fax:  [phone]  Name: [name] [name] MRN: [id number] Date of Birth: Feb 01, 1971

## 2021-06-02 NOTE — Patient Instructions (Signed)
Visuo-Vestibular: Head / Eyes Moving in Opposite Direction    Holding a target, keep eyes on target and slowly move target side to side while moving head in OPPOSITE direction of target for __60__ seconds. Repeat with head moving up and down.  Perform standing on solid surface, feet apart. Repeat __2__ times per session. Do __2__ sessions per day.       Feet Partial Heel-Toe (Compliant Surface) Head Motion - Eyes Closed    Stand on compliant surface: folded yoga mat with right foot partially in front of the other. Close eyes and move head slowly, side to side 10 times.  Then perform head up and down 10 times.  Switch feet and repeat. Repeat 1 times per session. Do 2 sessions per day.

## 2021-06-08 ENCOUNTER — Other Ambulatory Visit: Payer: Self-pay

## 2021-06-08 ENCOUNTER — Ambulatory Visit: Payer: BC Managed Care – PPO | Admitting: Physical Therapy

## 2021-06-08 DIAGNOSIS — R42 Dizziness and giddiness: Secondary | ICD-10-CM | POA: Diagnosis not present

## 2021-06-08 DIAGNOSIS — R2681 Unsteadiness on feet: Secondary | ICD-10-CM

## 2021-06-08 NOTE — Patient Instructions (Signed)
Visuo-Vestibular: Head / Eyes Moving in Opposite Direction    Standing with feet apart working towards feet together on folded Yoga Mat. Holding a target, keep eyes on target and slowly move target side to side while moving head in OPPOSITE direction of target for __60__ seconds. Repeat with head moving up and down.   Repeat __2__ times per session. Do __2__ sessions per day.       Feet Partial Heel-Toe (Compliant Surface) Head Motion - Eyes Closed    Stand on compliant surface: folded yoga mat with right foot partially in front of the other. Close eyes and move head slowly, side to side 10 times.  Then perform head up and down 10 times.  Switch feet and repeat. Repeat 1 times per session. Do 2 sessions per day.    Bending Down: no formal exercise just focus on increasing the amount of time you are bending down to do daily activities (putting away groceries, loading or unloading dishwasher, tying shoes, weeding, stretching).  Pay attention to symptoms, if getting mild symptoms take a break until they subside and then return to activity.

## 2021-06-08 NOTE — Therapy (Signed)
[facility] [address] [place], [place], [address] Phone: [phone]   Fax:  [phone]  Physical Therapy Treatment  Patient Details  Name: [name] [name] MRN: [id number] Date of Birth: 1971-05-08 Referring Provider (PT): Newman Pies, MD   Encounter Date: 06/08/2021   PT End of Session - 06/08/21 0809     Visit Number 3    Number of Visits 9    Date for PT Re-Evaluation 07/22/21    Authorization Type State BCBS    PT Start Time 0805    PT Stop Time 0841    PT Time Calculation (min) 36 min    Activity Tolerance Patient tolerated treatment well    Behavior During Therapy [facility] for tasks assessed/performed             No past medical history on file.  Past Surgical History:  Procedure Laterality Date   left knee arthroscopy and lateral release  Left    Completed when patient was in high school    There were no vitals filed for this visit.   Subjective Assessment - 06/08/21 0810     Subjective Having a good week, ran a 5K this past weekend without any issues.  Ran this morning without any issues.  Feels like she is getting better with exercises, up and down bother her more than side to side.    Pertinent History L knee arthroscopy, OA R knee, patellar subluxation, quadriceps tendonitis, Hallux valgus L foot, IT band syndrome, L lateral epicondylitis of elbow    Diagnostic tests Has not had MRI    Patient Stated Goals To recalibrate her vestibular system for more activity    Currently in Pain? No/denies                Vestibular Treatment/Exercise - 06/08/21 0811       Vestibular Treatment/Exercise   Vestibular Treatment Provided Gaze    Gaze Exercises X2 Viewing Horizontal;X2 Viewing Vertical      X2 Viewing Horizontal   Foot Position standing feet apart, feet together    Reps 2     Comments 60 seconds, improved ability to keep head      X2 Viewing Vertical   Foot Position standing feet  apart, feet together    Reps 2    Comments 60 seconds, mild symptoms.  Due to no issues with feet together, instructed pt to stand on folded yoga mat at home                Balance Exercises - 06/08/21 0846       Balance Exercises: Standing   Other Standing Exercises Habituation to bending forward to the ground: performed standing on solid surface and then compliant surface feet apart, bending down and reaching across midline for 3 > 6 targets and then handing to therapist up and to opposite side for diagonal head turns.  No symptoms after 2 sets of each.  Then performed SLS reaching down to the ground standing on foam x 3 to each direction x 2 sets, no symptoms.               PT Education - 06/08/21 1143     Education Details updated HEP    Person(s) Educated Patient    Methods Explanation;Demonstration;Handout    Comprehension Verbalized understanding;Returned demonstration              PT Short Term Goals - 06/02/21 1206       PT  SHORT TERM GOAL #1   Title Pt will participate in sensory integration assessment with SOT    Time 3    Period Weeks    Status Achieved    Target Date 06/13/21      PT SHORT TERM GOAL #2   Title Pt will initiate high level vestibular HEP    Time 3    Period Weeks    Status Achieved    Target Date 06/13/21               PT Long Term Goals - 06/02/21 1206       PT LONG TERM GOAL #1   Title Pt will demonstrate independence with final HEP    Time 8    Period Weeks    Status New    Target Date 07/22/21      PT LONG TERM GOAL #2   Title Pt will demonstrate WNL composite score and vestibular score on SOT    Baseline Composite score 80 - WNL    Time 8    Period Weeks    Status Achieved      PT LONG TERM GOAL #3   Title Pt will increase DFS to >/= 60 and DPS to >/= 63    Baseline DFS: 55; DPS: 58.4.  DES was WNL    Time 8    Period Weeks    Status New      PT LONG TERM GOAL #4   Title Pt will report ability to  run >8 miles and perform stretching afterwards without symptoms of dizziness    Time 8    Period Weeks    Status New      PT LONG TERM GOAL #5   Title Pt will report ability to bend forwards to weed/garden without any symptoms of dizziness    Time 8    Period Months    Status New      PT LONG TERM GOAL #6   Title Pt will demonstrate WNL VOR as indicated by 2 line difference on DVA    Baseline 3 line (8, 5)    Time 8    Period Weeks    Status New                   Plan - 06/08/21 1137     Clinical Impression Statement Able to progress x2 viewing for HEP by changing to standing on compliant surface; no change to staggered stance with eyes closed due to ongoing symptoms and increased sway.  Focused on attempting to provoke mild symptoms with bending to the floor in order to prescribe habituation exercise but unable to provoke symptoms during session today.  Encouraged pt to attempt to incorporate more forward bending into daily activities but to continue to monitor symptoms and keep symptoms mild.  Will continue to progress towards LTG.    Personal Factors and Comorbidities Comorbidity 3+    Comorbidities L knee arthroscopy, OA R knee, patellar subluxation, quadriceps tendonitis, Hallux valgus L foot, IT band syndrome, L lateral epicondylitis of elbow    Examination-Activity Limitations Bend;Other   Run   Examination-Participation Restrictions Community Activity    Stability/Clinical Decision Making Stable/Uncomplicated    Rehab Potential Good    PT Frequency 1x / week    PT Duration 8 weeks    PT Treatment/Interventions ADLs/Self Care Home Management;Functional mobility training;Therapeutic activities;Therapeutic exercise;Balance training;Neuromuscular re-education;Patient/family education;Vestibular    PT Next Visit Plan Progress x2 viewing to busy background, work  on bouncing on physioball or standing on BOSU adding in head turns or EC.  Pt is working towards running 1/2  marathon    Consulted and Agree with Plan of Care Patient             Patient will benefit from skilled therapeutic intervention in order to improve the following deficits and impairments:  Dizziness, Decreased balance  Visit Diagnosis: Dizziness and giddiness  Unsteadiness on feet     Problem List Patient Active Problem List   Diagnosis Date Noted   Lateral epicondylitis of left elbow 01/01/2019   Hx of calcium pyrophosphate deposition disease (CPPD) 11/07/2017   Left knee pain 03/12/2017   Iliotibial band syndrome 05/15/2016   Hallux valgus of left foot 07/13/2015   Quadriceps tendinitis 12/15/2013   Abnormal gait 05/27/2012   Osteoarthritis of right knee 03/05/2012   Patellar subluxation 03/05/2012    Dierdre Highman, PT, DPT 06/08/21    11:44 AM   [facility] [address] [place], [place], [address] Phone: [phone]   Fax:  [phone]  Name: Romanda Turrubiates MRN: [id number] Date of Birth: Feb 14, 1971

## 2021-06-16 ENCOUNTER — Ambulatory Visit: Payer: BC Managed Care – PPO | Admitting: Physical Therapy

## 2021-06-16 ENCOUNTER — Other Ambulatory Visit: Payer: Self-pay

## 2021-06-16 DIAGNOSIS — R2681 Unsteadiness on feet: Secondary | ICD-10-CM

## 2021-06-16 DIAGNOSIS — R42 Dizziness and giddiness: Secondary | ICD-10-CM

## 2021-06-16 NOTE — Patient Instructions (Addendum)
Visuo-Vestibular: Head / Eyes Moving in Opposite Direction    Holding a target, keep eyes on target and slowly move target side to side while moving head in OPPOSITE direction of target with busy background (polka dots or blinds) walking forwards and backwards twice followed by a 60 second bend down and stretch. Repeat forwards and backwards, twice with head moving up and down, followed by bend down and stretch.   Repeat __2__ times per session. Do __2__ sessions per day.     Seated Bouncing    Sit on ball. Bounce up and down continuously for 2 minutes while having someone toss a tennis ball to you in different directions/floor. Do __2_ sets of __2 minutes.   Increase running distance to 8-10 miles.  Weed 10 minutes every other day.

## 2021-06-16 NOTE — Therapy (Signed)
[facility] [address] [place], [place], [address] Phone: [phone]   Fax:  [phone]  Physical Therapy Treatment  Patient Details  Name: [name] [name] MRN: [id number] Date of Birth: December 13, 1970 Referring Provider (PT): Leta Baptist, MD   Encounter Date: 06/16/2021   PT End of Session - 06/16/21 0807     Visit Number 4    Number of Visits 9    Date for PT Re-Evaluation 07/22/21    Authorization Type State BCBS    PT Start Time 0807    PT Stop Time 0850    PT Time Calculation (min) 43 min    Activity Tolerance Patient tolerated treatment well    Behavior During Therapy [facility] for tasks assessed/performed             No past medical history on file.  Past Surgical History:  Procedure Laterality Date   left knee arthroscopy and lateral release  Left    Completed when patient was in high school    There were no vitals filed for this visit.   Subjective Assessment - 06/16/21 0808     Subjective Has been running up to 5 miles but not more. Allows herself a cool down and then performs regular stretching.  No symptoms.  Exercises are going well.    Pertinent History L knee arthroscopy, OA R knee, patellar subluxation, quadriceps tendonitis, Hallux valgus L foot, IT band syndrome, L lateral epicondylitis of elbow    Diagnostic tests Has not had MRI    Patient Stated Goals To recalibrate her vestibular system for more activity    Currently in Pain? No/denies               [facility] Adult PT Treatment/Exercise - 06/16/21 5038       Neuro Re-ed    Neuro Re-ed Details  Habituation for otolithic organs: Bouncing on ball x 2 minutes while catching tennis ball being tossed, bounced or rolled to pt in various directions.  Reported mild symptoms afterwards.             Vestibular Treatment/Exercise - 06/16/21 0810       Vestibular Treatment/Exercise   Vestibular Treatment Provided Gaze    Gaze Exercises X2  Viewing Horizontal;X2 Viewing Vertical      X2 Viewing Horizontal   Foot Position Walking forwards and backwards, first with open background and then busy background    Reps 5     Comments 11f and then 13fwith busy background.  No symptoms      X2 Viewing Vertical   Foot Position Walking forwards and backwards, first with open background and then busy background    Reps 5    Comments 3072fnd then 42f75fth busy background.  No symptoms                Balance Exercises - 06/16/21 1011       Balance Exercises: Standing   Standing Eyes Closed Narrow base of support (BOS);Head turns;Foam/compliant surface;Other reps (comment);Limitations    Standing Eyes Closed Limitations staggered stance on pillow, 10 reps head turns with R foot forwards and then L    SLS Eyes open;Foam/compliant surface;Cognitive challenge    Partial Tandem Stance Eyes open;Foam/compliant surface;Limitations    Partial Tandem Stance Limitations staggered stance R and then L foot forwards with visual-vestibular activity of catching and tossing tennis ball (bounced/tossed in various directions) and performing dual task of naming foods along the alphabet.  Changed to  SLS R and L foot for final 3-4 letters of alphabet.               PT Education - 06/16/21 1010     Education Details updated HEP, progress running distance    Person(s) Educated Patient    Methods Explanation;Demonstration;Handout    Comprehension Verbalized understanding;Returned demonstration              PT Short Term Goals - 06/02/21 1206       PT SHORT TERM GOAL #1   Title Pt will participate in sensory integration assessment with SOT    Time 3    Period Weeks    Status Achieved    Target Date 06/13/21      PT SHORT TERM GOAL #2   Title Pt will initiate high level vestibular HEP    Time 3    Period Weeks    Status Achieved    Target Date 06/13/21               PT Long Term Goals - 06/02/21 1206       PT  LONG TERM GOAL #1   Title Pt will demonstrate independence with final HEP    Time 8    Period Weeks    Status New    Target Date 07/22/21      PT LONG TERM GOAL #2   Title Pt will demonstrate WNL composite score and vestibular score on SOT    Baseline Composite score 80 - WNL    Time 8    Period Weeks    Status Achieved      PT LONG TERM GOAL #3   Title Pt will increase DFS to >/= 60 and DPS to >/= 63    Baseline DFS: 55; DPS: 58.4.  DES was WNL    Time 8    Period Weeks    Status New      PT LONG TERM GOAL #4   Title Pt will report ability to run >8 miles and perform stretching afterwards without symptoms of dizziness    Time 8    Period Weeks    Status New      PT LONG TERM GOAL #5   Title Pt will report ability to bend forwards to weed/garden without any symptoms of dizziness    Time 8    Period Months    Status New      PT LONG TERM GOAL #6   Title Pt will demonstrate WNL VOR as indicated by 2 line difference on DVA    Baseline 3 line (8, 5)    Time 8    Period Weeks    Status New              Plan - 06/16/21 0950     Clinical Impression Statement Pt is making excellent progress and has met both STG.  Able to progress x2 viewing today to more dynamic movement and with busy background.  Also began to incorporate dynamic standing balance incorporating visual-vestibular and dual tasking tasks without any symptoms.  Incorporated otolithic organ stimulation with bouncing while performing visual-vestibular activities with mild increase in symptoms.  Updated HEP based on progress.  Pt to begin to increase running distance.    Personal Factors and Comorbidities Comorbidity 3+    Comorbidities L knee arthroscopy, OA R knee, patellar subluxation, quadriceps tendonitis, Hallux valgus L foot, IT band syndrome, L lateral epicondylitis of elbow    Examination-Activity Limitations Bend;Other  Run   Examination-Participation Restrictions Community Activity     Stability/Clinical Decision Making Stable/Uncomplicated    Rehab Potential Good    PT Frequency 1x / week    PT Duration 8 weeks    PT Treatment/Interventions ADLs/Self Care Home Management;Functional mobility training;Therapeutic activities;Therapeutic exercise;Balance training;Neuromuscular re-education;Patient/family education;Vestibular    PT Next Visit Plan How is she doing increasing running distance?  Bouncing seems to bring on symptoms - physioball, balance on BOSU.  Pt is working towards running 1/2 marathon    Consulted and Agree with Plan of Care Patient             Patient will benefit from skilled therapeutic intervention in order to improve the following deficits and impairments:  Dizziness, Decreased balance  Visit Diagnosis: Dizziness and giddiness  Unsteadiness on feet     Problem List Patient Active Problem List   Diagnosis Date Noted   Lateral epicondylitis of left elbow 01/01/2019   Hx of calcium pyrophosphate deposition disease (CPPD) 11/07/2017   Left knee pain 03/12/2017   Iliotibial band syndrome 05/15/2016   Hallux valgus of left foot 07/13/2015   Quadriceps tendinitis 12/15/2013   Abnormal gait 05/27/2012   Osteoarthritis of right knee 03/05/2012   Patellar subluxation 03/05/2012    Rico Junker, PT, DPT 06/16/21    10:22 AM    [facility] [address] [place], [place], [address] Phone: [phone]   Fax:  [phone]  Name: Blimi Godby MRN: [id number] Date of Birth: 1971-04-18

## 2021-06-23 ENCOUNTER — Other Ambulatory Visit: Payer: Self-pay

## 2021-06-23 ENCOUNTER — Ambulatory Visit: Payer: BC Managed Care – PPO | Admitting: Physical Therapy

## 2021-06-23 DIAGNOSIS — R42 Dizziness and giddiness: Secondary | ICD-10-CM | POA: Diagnosis not present

## 2021-06-23 DIAGNOSIS — R2681 Unsteadiness on feet: Secondary | ICD-10-CM

## 2021-06-23 NOTE — Therapy (Signed)
[facility] [address] [place], [place], [address] Phone: [phone]   Fax:  [phone]  Physical Therapy Treatment  Patient Details  Name: [name] [name] MRN: [id number] Date of Birth: 02-06-1971 Referring Provider (PT): Newman Pies, MD   Encounter Date: 06/23/2021   PT End of Session - 06/23/21 0805     Visit Number 5    Number of Visits 9    Date for PT Re-Evaluation 07/22/21    Authorization Type State BCBS    PT Start Time 0805    PT Stop Time 0845    PT Time Calculation (min) 40 min    Activity Tolerance Patient tolerated treatment well    Behavior During Therapy [facility] for tasks assessed/performed             No past medical history on file.  Past Surgical History:  Procedure Laterality Date   left knee arthroscopy and lateral release  Left    Completed when patient was in high school    There were no vitals filed for this visit.   Subjective Assessment - 06/23/21 0806     Subjective Pt reports she feels there has been improvement. She's been doing trail running ~2 miles out and back without too many issues. Has been able to hike a 3 mile trail up and down. Pt reports not able to do the exercise with the therapy ball at home.    Pertinent History L knee arthroscopy, OA R knee, patellar subluxation, quadriceps tendonitis, Hallux valgus L foot, IT band syndrome, L lateral epicondylitis of elbow    Diagnostic tests Has not had MRI    Patient Stated Goals To recalibrate her vestibular system for more activity    Currently in Pain? No/denies                               [facility] Adult PT Treatment/Exercise - 06/23/21 0001       Neuro Re-ed    Neuro Re-ed Details  Habituation for otolithic organs: Bouncing on ball x 2 minutes while catching tennis ball being tossed, bounced or rolled to pt in various directions.  Bouncing on bosu blue side up x2 min while catching basketball and then  tennis ball in different directions             Vestibular Treatment/Exercise - 06/23/21 0001       Vestibular Treatment/Exercise   Habituation Exercises Standing Vertical Head Turns;Standing Horizontal Head Turns    Gaze Exercises X1 Viewing Horizontal;X1 Viewing Vertical      Standing Horizontal Head Turns   Symptom Description  x30 sec VOR cancellation with rotation; eyes still body bending rotating side to side standing on bosu black side up with moving background x30 sec      Standing Vertical Head Turns   Symptom Description  x30 sec VOR cancellation with bending, eyes still body bending forward/back standing on bosu black side up with moving background x30 sec      X1 Viewing Horizontal   Foot Position Bouncing on bosu ball and then bouncing on bosu blue side up + moving background    Comments x30 sec      X1 Viewing Vertical   Foot Position Bouncing on bosu ball and then bouncing on bosu blue side up + moving background    Comments x30 sec      X2 Viewing Horizontal   Foot Position Bouncing on  bosu ball and then bouncing on bosu blue side up + moving background, balancing on bosu black side up + moving background     Comments x30 sec each      X2 Viewing Vertical   Foot Position Bouncing on bosu ball and then bouncing on bosu blue side up + moving background, balancing on bosu black side up + moving background    Comments x30 sec                Balance Exercises - 06/23/21 0001       Balance Exercises: Standing   Standing Eyes Closed --   Balance master with responsive surface: x30 sec; with head turns x30 sec; head nods x30 sec              PT Education - 06/23/21 0904     Education Details Discussed progressing HEP at home. Discussed working on standing on compliant surface, eyes closed and narrowing BOS further into SLS; walking exercises with busier background + mental load. Educated pt on sensory integration of balance between vision,  somatosensory, and vestibular systems.    Person(s) Educated Patient    Methods Explanation    Comprehension Verbalized understanding              PT Short Term Goals - 06/02/21 1206       PT SHORT TERM GOAL #1   Title Pt will participate in sensory integration assessment with SOT    Time 3    Period Weeks    Status Achieved    Target Date 06/13/21      PT SHORT TERM GOAL #2   Title Pt will initiate high level vestibular HEP    Time 3    Period Weeks    Status Achieved    Target Date 06/13/21               PT Long Term Goals - 06/02/21 1206       PT LONG TERM GOAL #1   Title Pt will demonstrate independence with final HEP    Time 8    Period Weeks    Status New    Target Date 07/22/21      PT LONG TERM GOAL #2   Title Pt will demonstrate WNL composite score and vestibular score on SOT    Baseline Composite score 80 - WNL    Time 8    Period Weeks    Status Achieved      PT LONG TERM GOAL #3   Title Pt will increase DFS to >/= 60 and DPS to >/= 63    Baseline DFS: 55; DPS: 58.4.  DES was WNL    Time 8    Period Weeks    Status New      PT LONG TERM GOAL #4   Title Pt will report ability to run >8 miles and perform stretching afterwards without symptoms of dizziness    Time 8    Period Weeks    Status New      PT LONG TERM GOAL #5   Title Pt will report ability to bend forwards to weed/garden without any symptoms of dizziness    Time 8    Period Months    Status New      PT LONG TERM GOAL #6   Title Pt will demonstrate WNL VOR as indicated by 2 line difference on DVA    Baseline 3 line (8, 5)    Time  8    Period Weeks    Status New                   Plan - 06/23/21 0845     Clinical Impression Statement Pt appears close to d/c status. Pt able to tolerate x1, x2, and body movement exercises bouncing on bosu ball with moving balance master background. Continued to work on eyes closed in [employer] with head movements as pt  continues to find this challenging. Otherwise, pt remained asymptomatic throughout this treatment session. Pt notes increased L LE weight shift.    Personal Factors and Comorbidities Comorbidity 3+    Comorbidities L knee arthroscopy, OA R knee, patellar subluxation, quadriceps tendonitis, Hallux valgus L foot, IT band syndrome, L lateral epicondylitis of elbow    Examination-Activity Limitations Bend;Other   Run   Examination-Participation Restrictions Community Activity    Stability/Clinical Decision Making Stable/Uncomplicated    Rehab Potential Good    PT Frequency 1x / week    PT Duration 8 weeks    PT Treatment/Interventions ADLs/Self Care Home Management;Functional mobility training;Therapeutic activities;Therapeutic exercise;Balance training;Neuromuscular re-education;Patient/family education;Vestibular    PT Next Visit Plan Discussed with pt about d/c as she is having improved symptoms and has been asymptomatic with her hiking/trail running/and regular running. Pt request canceling next appointment and having 1-2 weeks to work on her exercises and increase her running time independently at home with hopes of d/c next visit. Pt is working towards running 1/2 marathon    PT Home Exercise Plan VORx2 walking forward & backwards with busier environment + mental load; balance on compliant surface with narrowing BOS (to work on getting into SLS) with EC and head turns; bouncing on yoga ball + visual tracking/catching ball; bending forward/weeding; and increase running time    Consulted and Agree with Plan of Care Patient             Patient will benefit from skilled therapeutic intervention in order to improve the following deficits and impairments:  Dizziness, Decreased balance  Visit Diagnosis: Dizziness and giddiness  Unsteadiness on feet     Problem List Patient Active Problem List   Diagnosis Date Noted   Lateral epicondylitis of left elbow 01/01/2019   Hx of calcium  pyrophosphate deposition disease (CPPD) 11/07/2017   Left knee pain 03/12/2017   Iliotibial band syndrome 05/15/2016   Hallux valgus of left foot 07/13/2015   Quadriceps tendinitis 12/15/2013   Abnormal gait 05/27/2012   Osteoarthritis of right knee 03/05/2012   Patellar subluxation 03/05/2012    [facility] [address] Nikodem Leadbetter PT, DPT 06/23/2021, 10:28 AM  [facility] [address] [place], [place], [address] Phone: [phone]   Fax:  [phone]  Name: [name] [name] MRN: [id number] Date of Birth: 01-06-1971

## 2021-06-26 ENCOUNTER — Ambulatory Visit: Payer: BC Managed Care – PPO

## 2021-07-10 ENCOUNTER — Other Ambulatory Visit: Payer: Self-pay

## 2021-07-10 ENCOUNTER — Ambulatory Visit: Payer: BC Managed Care – PPO | Attending: Otolaryngology | Admitting: Physical Therapy

## 2021-07-10 DIAGNOSIS — R42 Dizziness and giddiness: Secondary | ICD-10-CM | POA: Insufficient documentation

## 2021-07-10 DIAGNOSIS — R2681 Unsteadiness on feet: Secondary | ICD-10-CM | POA: Diagnosis present

## 2021-07-10 NOTE — Therapy (Signed)
[facility] [address] [place], [place], [address] Phone: [phone]   Fax:  [phone]  Physical Therapy Treatment  Patient Details  Name: [name] [name] MRN: [id number] Date of Birth: 06-16-1971 Referring Provider (PT): Leta Baptist, MD   Encounter Date: 07/10/2021   PT End of Session - 07/10/21 1319     Visit Number 6    Number of Visits 9    Date for PT Re-Evaluation 07/22/21    Authorization Type State BCBS    PT Start Time 8413    PT Stop Time 1340    PT Time Calculation (min) 23 min    Activity Tolerance Patient tolerated treatment well    Behavior During Therapy [facility] for tasks assessed/performed             No past medical history on file.  Past Surgical History:  Procedure Laterality Date   left knee arthroscopy and lateral release  Left    Completed when patient was in high school    There were no vitals filed for this visit.   Subjective Assessment - 07/10/21 1320     Subjective Doing really well.  Has worked her running distance up to about 7.5 miles trail running, running at [employer], [employer] and stretching.  No issues or episodes of dizziness.    Pertinent History L knee arthroscopy, OA R knee, patellar subluxation, quadriceps tendonitis, Hallux valgus L foot, IT band syndrome, L lateral epicondylitis of elbow    Diagnostic tests Has not had MRI    Patient Stated Goals To recalibrate her vestibular system for more activity    Currently in Pain? No/denies                [facility] PT Assessment - 07/10/21 1335       Observation/Other Assessments   Focus on Therapeutic Outcomes (FOTO)  DFS: 67, DPS: 69.8.  DES: no change but WNL at eval                 Vestibular Assessment - 07/10/21 1329       Visual Acuity   Static 8    Dynamic 7                 PT Education - 07/10/21 1356     Education Details Continue to progress running distance, cancel next week  session and then follow up on 8/26 for D/C vs. re-assessment based on symptoms, continue HEP    Person(s) Educated Patient    Methods Explanation    Comprehension Verbalized understanding              PT Short Term Goals - 06/02/21 1206       PT SHORT TERM GOAL #1   Title Pt will participate in sensory integration assessment with SOT    Time 3    Period Weeks    Status Achieved    Target Date 06/13/21      PT SHORT TERM GOAL #2   Title Pt will initiate high level vestibular HEP    Time 3    Period Weeks    Status Achieved    Target Date 06/13/21               PT Long Term Goals - 07/10/21 1322       PT LONG TERM GOAL #1   Title Pt will demonstrate independence with final HEP    Time 8    Period Weeks  Status Achieved      PT LONG TERM GOAL #2   Title Pt will demonstrate WNL composite score and vestibular score on SOT    Baseline Composite score 80 - WNL    Time 8    Period Weeks    Status Achieved      PT LONG TERM GOAL #3   Title Pt will increase DFS to >/= 60 and DPS to >/= 63    Baseline DFS: 55; DPS: 58.4.  DES was WNL    Time 8    Period Weeks    Status Achieved      PT LONG TERM GOAL #4   Title Pt will report ability to run >8 miles and perform stretching afterwards without symptoms of dizziness    Baseline 7.5 miles and stretching without dizziness    Time 8    Period Weeks    Status Partially Met      PT LONG TERM GOAL #5   Title Pt will report ability to bend forwards to weed/garden without any symptoms of dizziness    Baseline No dizziness with weeding    Time 8    Period Months    Status Achieved      PT LONG TERM GOAL #6   Title Pt will demonstrate WNL VOR as indicated by 2 line difference on DVA    Baseline <1 line (8,7)    Time 8    Period Weeks    Status Achieved                   Plan - 07/10/21 1352     Clinical Impression Statement Performed assessment of progress towards LTG.  Pt has made excellent  progress and has met 5/6 LTG.  Pt reports WNL scores on FOTO for DPS, DFS and DES.  Pt is no longer experiencing dizziness with quick head turns or bending down to the ground and now demonstrates only 1 line difference on DVA indicating improved VOR gain.  Pt is still working towards running longer distances (>8 miles).  Plan to allow pt two more weeks to increase running distance and then determine if pt is ready for D/C or will require ongoing vestibular rehab.    Personal Factors and Comorbidities Comorbidity 3+    Comorbidities L knee arthroscopy, OA R knee, patellar subluxation, quadriceps tendonitis, Hallux valgus L foot, IT band syndrome, L lateral epicondylitis of elbow    Examination-Activity Limitations Bend;Other   Run   Examination-Participation Restrictions Community Activity    Stability/Clinical Decision Making Stable/Uncomplicated    Rehab Potential Good    PT Frequency 1x / week    PT Duration 8 weeks    PT Treatment/Interventions ADLs/Self Care Home Management;Functional mobility training;Therapeutic activities;Therapeutic exercise;Balance training;Neuromuscular re-education;Patient/family education;Vestibular    PT Next Visit Plan Working up running distance >8 miles - how is she doing with symptoms - continue/recert or D/C?    PT Home Exercise Plan VORx2 walking forward & backwards with busier environment + mental load; balance on compliant surface with narrowing BOS (to work on getting into SLS) with EC and head turns; bouncing on yoga ball + visual tracking/catching ball; bending forward/weeding; and increase running time    Consulted and Agree with Plan of Care Patient             Patient will benefit from skilled therapeutic intervention in order to improve the following deficits and impairments:  Dizziness, Decreased balance  Visit Diagnosis: Dizziness and giddiness  Unsteadiness on feet     Problem List Patient Active Problem List   Diagnosis Date Noted    Lateral epicondylitis of left elbow 01/01/2019   Hx of calcium pyrophosphate deposition disease (CPPD) 11/07/2017   Left knee pain 03/12/2017   Iliotibial band syndrome 05/15/2016   Hallux valgus of left foot 07/13/2015   Quadriceps tendinitis 12/15/2013   Abnormal gait 05/27/2012   Osteoarthritis of right knee 03/05/2012   Patellar subluxation 03/05/2012    Rico Junker, PT, DPT 07/10/21    1:58 PM    [facility] [address] [place], [place], [address] Phone: [phone]   Fax:  [phone]  Name: Marthena Whitmyer MRN: [id number] Date of Birth: 1971/11/15

## 2021-07-21 ENCOUNTER — Encounter: Payer: BC Managed Care – PPO | Admitting: Physical Therapy

## 2021-07-28 ENCOUNTER — Encounter: Payer: BC Managed Care – PPO | Admitting: Physical Therapy

## 2023-02-20 LAB — COLOGUARD: COLOGUARD: NEGATIVE

## 2023-06-05 ENCOUNTER — Ambulatory Visit (INDEPENDENT_AMBULATORY_CARE_PROVIDER_SITE_OTHER): Payer: BC Managed Care – PPO | Admitting: Sports Medicine

## 2023-06-05 VITALS — BP 118/68 | Ht 63.0 in | Wt 110.0 lb

## 2023-06-05 DIAGNOSIS — R269 Unspecified abnormalities of gait and mobility: Secondary | ICD-10-CM

## 2023-06-05 NOTE — Progress Notes (Signed)
Patient comes in for new orthotics and to discuss the bunion on her left foot  She is alert and athletic runner who trains regularly.  Her feet show preserved longitudinal arch.  On the left foot there is a bunion with hallux valgus shaft that is increased somewhat over the last few years.  On the right foot there is slight hallux valgus but no bunion formation Otherwise her alignment is excellent Her strength is excellent Her left leg is about 1 to 2 cm shorter than the right  Patient was fitted for a : standard, cushioned, semi-rigid orthotic. The orthotic was heated and afterward the patient stood on the orthotic blank positioned on the orthotic stand. The patient was positioned in subtalar neutral position and 10 degrees of ankle dorsiflexion in a weight bearing stance. After completion of molding, a stable base was applied to the orthotic blank. The blank was ground to a stable position for weight bearing. Size: 7 Blue medium EVA Base: left with 3/4 felt insole Posting: Bunion pad Additional orthotic padding: Rt heel cusion with blue foam rubber  At the completion of the orthotics the patient's running gait was corrected to neutral and she had good comfort. She will try these over the next month and we will make corrections if needed.    Echo is still quite functional.  I did give her some spacers and bunion shield to try to see if it lessens pressure over the bunion.  She will need shoes to accommodate this. I do not think her symptoms or the deformity warrants surgical correction at this time.

## 2023-06-05 NOTE — Assessment & Plan Note (Signed)
New orthotics prepared today They do an excellent job of neutralizing her gait.  We did discuss her left bunion and handed bunion pads to her orthotics

## 2024-02-06 ENCOUNTER — Other Ambulatory Visit: Payer: Self-pay | Admitting: Family Medicine

## 2024-02-06 DIAGNOSIS — R636 Underweight: Secondary | ICD-10-CM

## 2024-10-05 ENCOUNTER — Other Ambulatory Visit: Payer: Self-pay
